# Patient Record
Sex: Female | Born: 1955 | Race: Black or African American | Hispanic: No | Smoking: Never smoker
Health system: Southern US, Community
[De-identification: ages and names within clinical notes are randomized; demographics above are authoritative.]

## PROBLEM LIST (undated history)

## (undated) DIAGNOSIS — M549 Dorsalgia, unspecified: Secondary | ICD-10-CM

## (undated) DIAGNOSIS — I10 Essential (primary) hypertension: Secondary | ICD-10-CM

## (undated) DIAGNOSIS — Z8489 Family history of other specified conditions: Secondary | ICD-10-CM

## (undated) DIAGNOSIS — IMO0001 Reserved for inherently not codable concepts without codable children: Secondary | ICD-10-CM

## (undated) DIAGNOSIS — G8929 Other chronic pain: Secondary | ICD-10-CM

## (undated) DIAGNOSIS — Z5189 Encounter for other specified aftercare: Secondary | ICD-10-CM

## (undated) DIAGNOSIS — G709 Myoneural disorder, unspecified: Secondary | ICD-10-CM

## (undated) DIAGNOSIS — M1990 Unspecified osteoarthritis, unspecified site: Secondary | ICD-10-CM

## (undated) DIAGNOSIS — E785 Hyperlipidemia, unspecified: Secondary | ICD-10-CM

## (undated) HISTORY — PX: COLONOSCOPY: SHX174

## (undated) HISTORY — PX: POLYPECTOMY: SHX149

## (undated) HISTORY — PX: BACK SURGERY: SHX140

## (undated) HISTORY — DX: Hyperlipidemia, unspecified: E78.5

## (undated) HISTORY — DX: Encounter for other specified aftercare: Z51.89

## (undated) HISTORY — PX: TRIGGER FINGER RELEASE: SHX641

## (undated) HISTORY — DX: Myoneural disorder, unspecified: G70.9

## (undated) HISTORY — DX: Reserved for inherently not codable concepts without codable children: IMO0001

## (undated) HISTORY — DX: Unspecified osteoarthritis, unspecified site: M19.90

---

## 2011-01-06 ENCOUNTER — Encounter (HOSPITAL_COMMUNITY): Payer: Self-pay

## 2011-01-06 ENCOUNTER — Encounter: Payer: Self-pay | Admitting: Emergency Medicine

## 2011-01-06 ENCOUNTER — Emergency Department (HOSPITAL_COMMUNITY)
Admission: EM | Admit: 2011-01-06 | Discharge: 2011-01-06 | Disposition: A | Discharge status: Home / Self Care | Payer: Medicaid Other | Source: Home / Self Care | Attending: Family Medicine | Admitting: Family Medicine

## 2011-01-06 ENCOUNTER — Emergency Department (HOSPITAL_COMMUNITY)
Admission: EM | Admit: 2011-01-06 | Discharge: 2011-01-06 | Disposition: A | Discharge status: Home / Self Care | Payer: Medicaid Other | Attending: Emergency Medicine | Admitting: Emergency Medicine

## 2011-01-06 DIAGNOSIS — I889 Nonspecific lymphadenitis, unspecified: Secondary | ICD-10-CM | POA: Insufficient documentation

## 2011-01-06 DIAGNOSIS — R209 Unspecified disturbances of skin sensation: Secondary | ICD-10-CM | POA: Insufficient documentation

## 2011-01-06 DIAGNOSIS — M545 Low back pain, unspecified: Secondary | ICD-10-CM | POA: Insufficient documentation

## 2011-01-06 DIAGNOSIS — M79609 Pain in unspecified limb: Secondary | ICD-10-CM | POA: Insufficient documentation

## 2011-01-06 DIAGNOSIS — R220 Localized swelling, mass and lump, head: Secondary | ICD-10-CM | POA: Insufficient documentation

## 2011-01-06 DIAGNOSIS — I10 Essential (primary) hypertension: Secondary | ICD-10-CM | POA: Insufficient documentation

## 2011-01-06 DIAGNOSIS — G8929 Other chronic pain: Secondary | ICD-10-CM | POA: Insufficient documentation

## 2011-01-06 DIAGNOSIS — Z79899 Other long term (current) drug therapy: Secondary | ICD-10-CM | POA: Insufficient documentation

## 2011-01-06 DIAGNOSIS — M549 Dorsalgia, unspecified: Secondary | ICD-10-CM

## 2011-01-06 DIAGNOSIS — H9209 Otalgia, unspecified ear: Secondary | ICD-10-CM | POA: Insufficient documentation

## 2011-01-06 HISTORY — DX: Dorsalgia, unspecified: M54.9

## 2011-01-06 HISTORY — DX: Essential (primary) hypertension: I10

## 2011-01-06 HISTORY — DX: Other chronic pain: G89.29

## 2011-01-06 MED ORDER — PENICILLIN V POTASSIUM 500 MG PO TABS: 500.0000 mg | ORAL_TABLET | Freq: Four times a day (QID) | ORAL | Status: AC

## 2011-01-06 MED ORDER — HYDROCODONE-ACETAMINOPHEN 5-325 MG PO TABS: 1.0000 | ORAL_TABLET | Freq: Four times a day (QID) | ORAL | Status: AC | PRN

## 2011-01-06 MED ORDER — NAPROXEN 500 MG PO TABS: 500.0000 mg | ORAL_TABLET | Freq: Two times a day (BID) | ORAL | Status: DC

## 2011-01-06 MED ORDER — CYCLOBENZAPRINE HCL 10 MG PO TABS: 10.0000 mg | ORAL_TABLET | Freq: Three times a day (TID) | ORAL | Status: AC | PRN

## 2011-01-06 NOTE — ED Provider Notes (Signed)
History     CSN: [id number] Arrival date & time: 01/06/2011  3:14 PM   First MD Initiated Contact with Patient 01/06/11 1516      Chief Complaint  Patient presents with  . Follow-up    (Consider location/radiation/quality/duration/timing/severity/associated sxs/prior treatment) HPI Comments: Pt here right after leaving the emergency department after being evaluated and treated by an ED physician for chronic back pain exacerbation and left side cervical adenitis. Appears as she got confused as she was told to follow up here and she though she needed to do it today. She has prescriptions for antibiotic, analgesics, antiinflammatories and muscle relaxant all appropriate for her diagnosis. Denies any new symptom since she was seen in the ED.      The history is provided by the patient.    Past Medical History  Diagnosis Date  . Chronic back pain   . Hypertension     Past Surgical History  Procedure Date  . Back surgery   . Cesarean section   . Trigger finger release     No family history on file.  History  Substance Use Topics  . Smoking status: Never Smoker   . Smokeless tobacco: Not on file  . Alcohol Use: No    OB History    Grav Para Term Preterm Abortions TAB SAB Ect Mult Living                  Review of Systems  Constitutional: Negative for fever and chills.  HENT: Positive for ear pain. Negative for congestion, sore throat, trouble swallowing, dental problem, sinus pressure and tinnitus.   Eyes: Negative for visual disturbance.  Respiratory: Negative for cough and shortness of breath.   Cardiovascular: Negative for chest pain.  Gastrointestinal: Negative for nausea, vomiting, abdominal pain and bowel incontinence.  Genitourinary: Negative for bladder incontinence, dysuria and hematuria.  Musculoskeletal: Positive for back pain.  Skin: Negative for rash.  Neurological: Positive for tingling, numbness and paresthesias. Negative for weakness and  headaches.  Hematological: Does not bruise/bleed easily.    Allergies  Review of patient's allergies indicates no known allergies.  Home Medications   Current Outpatient Rx  Name Route Sig Dispense Refill  . ACETAMINOPHEN 500 MG PO TABS Oral Take 500 mg by mouth every 6 (six) hours as needed. For pain     . CYCLOBENZAPRINE HCL 10 MG PO TABS Oral Take 1 tablet (10 mg total) by mouth 3 (three) times daily as needed for muscle spasms. 20 tablet 0  . HYDROCODONE-ACETAMINOPHEN 5-325 MG PO TABS Oral Take 1-2 tablets by mouth every 6 (six) hours as needed for pain. 14 tablet 0  . LOSARTAN POTASSIUM-HCTZ 100-12.5 MG PO TABS Oral Take 1 tablet by mouth daily.      Marland Kitchen NAPROXEN 500 MG PO TABS Oral Take 1 tablet (500 mg total) by mouth 2 (two) times daily. 14 tablet 0  . PENICILLIN V POTASSIUM 500 MG PO TABS Oral Take 1 tablet (500 mg total) by mouth 4 (four) times daily. 40 tablet 0  . RED YEAST RICE PO Oral Take 1 tablet by mouth every evening.        BP 150/82  Pulse 72  Temp(Src) 98 F (36.7 C) (Oral)  Resp 16  SpO2 100%  Physical Exam  Nursing note and vitals reviewed. Constitutional: She is oriented to person, place, and time. She appears well-developed and well-nourished. No distress.  HENT:  Head: Normocephalic and atraumatic.  Right Ear: External ear normal.  Left  Ear: External ear normal.  Nose: Nose normal.  Mouth/Throat: Oropharynx is clear and moist. No oropharyngeal exudate.       Normal TMs bilaterally. Mouth: Wears upper dentures. Several pieces missing in lower jaw with mild periodontal disease but no obvious abscess or focal tenderness.  Mouth floor feels normal with no xialolithiasis palpated.   Eyes: Conjunctivae and EOM are normal. Pupils are equal, round, and reactive to light.  Neck: Normal range of motion. Neck supple. No tracheal deviation present. No thyromegaly present.       Left side submandibular mass impress enlarged minimally tender submandibular  lymphadenopathy.  Cardiovascular: Normal rate, regular rhythm and normal heart sounds.   Pulmonary/Chest: Effort normal and breath sounds normal. She has no wheezes. She has no rales.  Neurological: She is alert and oriented to person, place, and time.    ED Course  Procedures (including critical care time)  Labs Reviewed - No data to display No results found.   1. Chronic back pain   2. Cervical adenitis       MDM  I reviewed Dr. Velta Addison note from 3 hours ago today, also performed brief physical exam. Pt history and findings on exam are unchanged from prior. I agree with the prior diagnosis and her prescriptions given to patient appear appropriate.  Explained to patient she needs to complete her treatment and return if persistent symptoms or return earlier if worsening or new symptoms like fever. I actually encouraged her to follow up with her PCP even if symptoms resolve for follow up.  Then if persistent neck swelling after complete treatment she should follow up either with her PCP or go to the emergency department as in that case is very likely she would need neck imaging like CT or MRI which we don't have the capabilities to perform at [facility]. Pt. Visit was charged with a level 2.       Sharin Grave, MD 01/07/11 1233

## 2011-01-06 NOTE — ED Notes (Signed)
Pt just left ED, states they were concerned about the swelling to lt side of neck and told her to come here.  Pt has papers and rx x 4 from ED. Unable to get clear picture of why pt presents here.  Pt states she needs rx for pain patches she used previously.

## 2011-01-06 NOTE — ED Notes (Signed)
Pt sts hx of chronic back pain and surgery; pt sts pain in lower back x several weeks and pt sts is out of her meds; pt c/o sore throat and left ear pain x several weeks

## 2011-01-06 NOTE — ED Provider Notes (Signed)
History     CSN: [id number] Arrival date & time: 01/06/2011 10:53 AM   First MD Initiated Contact with Patient 01/06/11 1117      Chief Complaint  Patient presents with  . Back Pain  . Otalgia    (Consider location/radiation/quality/duration/timing/severity/associated sxs/prior treatment) Patient is a 54 y.o. female presenting with back pain and ear pain. The history is provided by the patient.  Back Pain  This is a chronic problem. Episode onset: Acute exacerbation of her chronic back pain 2 weeks ago. The problem occurs constantly. The problem has not changed since onset.The pain is associated with no known injury. The pain is present in the lumbar spine. The quality of the pain is described as shooting, aching and stabbing. The pain radiates to the left thigh. The pain is at a severity of 8/10. The pain is moderate. The symptoms are aggravated by bending, twisting and certain positions. Associated symptoms include numbness, leg pain, paresthesias and tingling. Pertinent negatives include no chest pain, no fever, no headaches, no abdominal pain, no bowel incontinence, no perianal numbness, no bladder incontinence, no dysuria, no paresis and no weakness. She has tried NSAIDs for the symptoms. The treatment provided mild relief.  Otalgia Pertinent negatives include no headaches, no sore throat, no abdominal pain, no vomiting, no cough and no rash.   patient with several week history of complaints of soreness to the left ear and swelling in the submandibular area. No significant pain however pain would be about 2/10 no complain of any tooth pain no fever, the swelling has increased slightly. Patient also has a history of hypertension as ordered and [facility].   Past Medical History  Diagnosis Date  . Chronic back pain     History reviewed. No pertinent past surgical history.  History reviewed. No pertinent family history.  History  Substance Use Topics  . Smoking status:  Never Smoker   . Smokeless tobacco: Not on file  . Alcohol Use: No    OB History    Grav Para Term Preterm Abortions TAB SAB Ect Mult Living                  Review of Systems  Constitutional: Negative for fever and chills.  HENT: Positive for ear pain. Negative for congestion, sore throat, trouble swallowing, dental problem, sinus pressure and tinnitus.   Eyes: Negative for visual disturbance.  Respiratory: Negative for cough and shortness of breath.   Cardiovascular: Negative for chest pain.  Gastrointestinal: Negative for nausea, vomiting, abdominal pain and bowel incontinence.  Genitourinary: Negative for bladder incontinence, dysuria and hematuria.  Musculoskeletal: Positive for back pain.  Skin: Negative for rash.  Neurological: Positive for tingling, numbness and paresthesias. Negative for weakness and headaches.  Hematological: Does not bruise/bleed easily.    Allergies  Review of patient's allergies indicates no known allergies.  Home Medications   Current Outpatient Rx  Name Route Sig Dispense Refill  . ACETAMINOPHEN 500 MG PO TABS Oral Take 500 mg by mouth every 6 (six) hours as needed. For pain     . LOSARTAN POTASSIUM-HCTZ 100-12.5 MG PO TABS Oral Take 1 tablet by mouth daily.      . RED YEAST RICE PO Oral Take 1 tablet by mouth every evening.      . CYCLOBENZAPRINE HCL 10 MG PO TABS Oral Take 1 tablet (10 mg total) by mouth 3 (three) times daily as needed for muscle spasms. 20 tablet 0  . HYDROCODONE-ACETAMINOPHEN 5-325 MG  PO TABS Oral Take 1-2 tablets by mouth every 6 (six) hours as needed for pain. 14 tablet 0  . NAPROXEN 500 MG PO TABS Oral Take 1 tablet (500 mg total) by mouth 2 (two) times daily. 14 tablet 0  . PENICILLIN V POTASSIUM 500 MG PO TABS Oral Take 1 tablet (500 mg total) by mouth 4 (four) times daily. 40 tablet 0    BP 159/94  Pulse 95  Temp(Src) 97.8 F (36.6 C) (Oral)  Resp 20  SpO2 99%  Physical Exam  Nursing note and vitals  reviewed. Constitutional: She is oriented to person, place, and time. She appears well-developed and well-nourished. No distress.  HENT:  Head: Normocephalic and atraumatic.  Right Ear: External ear normal.  Left Ear: External ear normal.  Mouth/Throat: Oropharynx is clear and moist.       Noted facial swelling on the left side more around the area and cheek and jaw, left submandibular swelling and palpable 1 x 1.5 cm mass without significant tenderness and no redness. Oral exam appears normal some dental issues but nothing obvious swelling. No obvious tooth abscess.  Eyes: Conjunctivae are normal. Pupils are equal, round, and reactive to light.  Neck: Normal range of motion. Neck supple. No tracheal deviation present.       Left submandibular mass measuring about 1 cm to 1-1/2 cm in size no significant tenderness suggestive of an enlarged lymph node surrounding swelling and some swelling up around the jaw and cheek area, no erythema. No obvious thyroid nodule.   Cardiovascular: Normal rate, regular rhythm, normal heart sounds and intact distal pulses.   Pulmonary/Chest: Effort normal and breath sounds normal.  Abdominal: Soft. Bowel sounds are normal. There is no tenderness.  Musculoskeletal: Normal range of motion.       Mild tenderness to bilateral lumbar area of the back greater on the left side. Left lower trimming without focal deficit distally although patient describes numbness to the lateral aspect of the left thigh.  Lymphadenopathy:    She has cervical adenopathy.  Neurological: She is alert and oriented to person, place, and time. No cranial nerve deficit. She exhibits normal muscle tone. Coordination normal.  Skin: Skin is warm. No rash noted.    ED Course  Procedures (including critical care time)  Labs Reviewed - No data to display No results found.   1. Back pain, chronic   2. Cervical adenitis       MDM   Patient presents to emergency department with 2  complaints. First is she has a history of chronic back pain with left-sided sciatica kind of symptoms that have been long-term. On Thanksgiving she exacerbated her chronic back pain has been increased pain. She is new to the area [facility]. The other problem is left submandibular and mandibular swelling that has been present for several weeks. Patient feels it is related to her ear. Exam however shows no TM or ear findings consistent with problem the location of the swelling is consistent with adenitis in the submandibular area and more suggestive of a tooth problem in others no tooth pain. Will Rx with penicillin and patient will need close followup if does not improve. Patient given resource guide for followup for both problems, and also verbally was given directions to the [facility]. The back will be treated with Flexeril Naprosyn and a limited prescription of hydrocodone.  Patient's back pain complaint although an accessory duration of her normal back pain is  status quo regarding any of the neuro findings of the left lower extremity.        Shelda Jakes, MD 01/06/11 [phone]

## 2011-02-14 ENCOUNTER — Emergency Department (HOSPITAL_COMMUNITY)
Admission: EM | Admit: 2011-02-14 | Discharge: 2011-02-14 | Disposition: A | Discharge status: Home / Self Care | Payer: Medicaid Other | Attending: Emergency Medicine | Admitting: Emergency Medicine

## 2011-02-14 ENCOUNTER — Encounter (HOSPITAL_COMMUNITY): Payer: Self-pay | Admitting: *Deleted

## 2011-02-14 DIAGNOSIS — E049 Nontoxic goiter, unspecified: Secondary | ICD-10-CM | POA: Insufficient documentation

## 2011-02-14 DIAGNOSIS — J029 Acute pharyngitis, unspecified: Secondary | ICD-10-CM | POA: Insufficient documentation

## 2011-02-14 DIAGNOSIS — I10 Essential (primary) hypertension: Secondary | ICD-10-CM | POA: Insufficient documentation

## 2011-02-14 DIAGNOSIS — G8929 Other chronic pain: Secondary | ICD-10-CM | POA: Insufficient documentation

## 2011-02-14 DIAGNOSIS — M549 Dorsalgia, unspecified: Secondary | ICD-10-CM | POA: Insufficient documentation

## 2011-02-14 DIAGNOSIS — R591 Generalized enlarged lymph nodes: Secondary | ICD-10-CM

## 2011-02-14 DIAGNOSIS — R599 Enlarged lymph nodes, unspecified: Secondary | ICD-10-CM | POA: Insufficient documentation

## 2011-02-14 DIAGNOSIS — E010 Iodine-deficiency related diffuse (endemic) goiter: Secondary | ICD-10-CM

## 2011-02-14 LAB — BASIC METABOLIC PANEL
CO2: 29 mEq/L (ref 19–32)
Calcium: 10 mg/dL (ref 8.4–10.5)
Chloride: 100 mEq/L (ref 96–112)
Creatinine, Ser: 0.79 mg/dL (ref 0.50–1.10)
Glucose, Bld: 111 mg/dL — ABNORMAL HIGH (ref 70–99)
Sodium: 139 mEq/L (ref 135–145)

## 2011-02-14 LAB — DIFFERENTIAL
Basophils Absolute: 0 10*3/uL (ref 0.0–0.1)
Basophils Relative: 0 % (ref 0–1)
Eosinophils Absolute: 0.1 K/uL (ref 0.0–0.7)
Eosinophils Relative: 1 % (ref 0–5)
Lymphocytes Relative: 42 % (ref 12–46)
Lymphs Abs: 2.3 10*3/uL (ref 0.7–4.0)
Monocytes Absolute: 0.4 10*3/uL (ref 0.1–1.0)
Monocytes Relative: 7 % (ref 3–12)
Neutro Abs: 2.7 10*3/uL (ref 1.7–7.7)
Neutrophils Relative %: 49 % (ref 43–77)

## 2011-02-14 LAB — BASIC METABOLIC PANEL WITH GFR
BUN: 13 mg/dL (ref 6–23)
GFR calc Af Amer: >90 mL/min (ref >90)
GFR calc non Af Amer: >90 mL/min (ref >90)
Potassium: 4.1 meq/L (ref 3.5–5.1)

## 2011-02-14 LAB — CBC
HCT: 38.9 % (ref 36.0–46.0)
Hemoglobin: 12.9 g/dL (ref 12.0–15.0)
MCH: 32.2 pg (ref 26.0–34.0)
MCHC: 33.2 g/dL (ref 30.0–36.0)
MCV: 97 fL (ref 78.0–100.0)
Platelets: 238 K/uL (ref 150–400)
RBC: 4.01 MIL/uL (ref 3.87–5.11)
RDW: 12.6 % (ref 11.5–15.5)
WBC: 5.4 10*3/uL (ref 4.0–10.5)

## 2011-02-14 LAB — T4, FREE: Free T4: 1 ng/dL (ref 0.80–1.80)

## 2011-02-14 LAB — TSH: TSH: 1.482 u[IU]/mL (ref 0.350–4.500)

## 2011-02-14 NOTE — ED Notes (Signed)
Patient here for swelling to left side of thraot, hx of same and seen 1 month ago, tx with abx, sts that she was told if no improvement she may need a ct scan. Also complains of dry cough at night

## 2011-02-14 NOTE — ED Provider Notes (Signed)
History     CSN: [id number]  Arrival date & time 02/14/11  1056   First MD Initiated Contact with Patient 02/14/11 1132      Chief Complaint  Patient presents with  . Sore Throat    (Consider location/radiation/quality/duration/timing/severity/associated sxs/prior treatment) HPI Comments: Patient presented in early December 12 some back problems but also some left ear pain associated with some swelling on the left side of her neck. She is told that she had an enlarged lymph node and was given antibiotics. She reports the antibiotics did not help. She reports the area is not exactly painful or tender but she can feel a fullness in the left side of her neck and also when she swallows she again feels a fullness on that side. She denies any history of lymphoma or thyroid problems. She reports that she has gained some weight in the last few years but she attributed that to her chronic back problems. She did establish without the medical group and sees Dr. Concepcion Elk who is currently treating her high blood pressure as well as cholesterol. She reports that she has been seeing some success with this. She reports that he also is aware of that swollen area of her neck but was not doing anything about it at this time because he was more focused on her blood pressure and cholesterol. She denies fevers or chills. She denies rash or stiff neck. She denies chest pain, cough, shortness of breath. She reports her current back problems are at its baseline with no new recent injuries.  Patient is a 56 y.o. female presenting with pharyngitis. The history is provided by the patient.  Sore Throat Pertinent negatives include no shortness of breath.    Past Medical History  Diagnosis Date  . Chronic back pain   . Hypertension     Past Surgical History  Procedure Date  . Back surgery   . Cesarean section   . Trigger finger release     History reviewed. No pertinent family history.  History  Substance Use  Topics  . Smoking status: Never Smoker   . Smokeless tobacco: Not on file  . Alcohol Use: No    OB History    Grav Para Term Preterm Abortions TAB SAB Ect Mult Living                  Review of Systems  Constitutional: Positive for fatigue. Negative for fever and unexpected weight change.  HENT: Positive for neck pain. Negative for neck stiffness.   Respiratory: Negative for cough, chest tightness and shortness of breath.   Musculoskeletal: Positive for back pain.  Skin: Negative for rash.  Hematological: Positive for adenopathy.    Allergies  Lortab  Home Medications   Current Outpatient Rx  Name Route Sig Dispense Refill  . OVER THE COUNTER MEDICATION Oral Take 1 capsule by mouth at bedtime. Fish Oil w/ vitamin D3  OTC.    Marland Kitchen PRESCRIPTION MEDICATION Transdermal Place 1 patch onto the skin every 12 (twelve) hours as needed. As needed for pain. 12 hours on/off. Pain patch.    Marland Kitchen SIMVASTATIN 40 MG PO TABS Oral Take 40 mg by mouth at bedtime.    . TELMISARTAN-HCTZ 40-12.5 MG PO TABS Oral Take 1 tablet by mouth daily.      BP 136/81  Pulse 83  Temp(Src) 97.8 F (36.6 C) (Oral)  Resp 20  SpO2 97%  Physical Exam  Nursing note and vitals reviewed. Constitutional: She appears well-developed and  well-nourished.  HENT:  Head: Normocephalic and atraumatic.    Mouth/Throat: Uvula is midline, oropharynx is clear and moist and mucous membranes are normal.  Eyes: Pupils are equal, round, and reactive to light.  Neck: Normal range of motion. Neck supple.  Pulmonary/Chest: Effort normal and breath sounds normal. No accessory muscle usage. Not tachypneic. No respiratory distress. She has no decreased breath sounds. She has no wheezes. She has no rhonchi.  Abdominal: Soft. Bowel sounds are normal.  Neurological: She is alert.  Skin: Skin is warm. No rash noted. No pallor.    ED Course  Procedures (including critical care time)   Labs Reviewed  TSH  T4, FREE  CBC    DIFFERENTIAL  BASIC METABOLIC PANEL   No results found.   1. Thyromegaly   2. Lymphadenopathy       MDM  Will draw TSH and free T 4 and leave note to Dr. Concepcion Elk to recheck these labs and that she can follow up with PCP regarding these symptoms.  No emergent condition exists at this time.  No stridor, RA sats are 97% and normal, no abn lung sounds on auscultation.  BP and other vital signs are WNL and seem controlled on her antihypertensives.            Gavin Pound. Arvis Zwahlen, MD 02/14/11 1155

## 2011-02-14 NOTE — ED Notes (Signed)
To ed for further eval of sore throat. States she was in ed for same last month. Given abx, but states she it's feeling better.

## 2011-02-14 NOTE — Discharge Instructions (Signed)
 Swollen Lymph Nodes The lymphatic system filters fluid from around cells. It is like a system of blood vessels. These channels carry lymph instead of blood. The lymphatic system is an important part of the immune (disease fighting) system. When people talk about swollen glands in the neck, they are usually talking about swollen lymph nodes. The lymph nodes are like the little traps for infection. You and your caregiver may be able to feel lymph nodes, especially swollen nodes, in these common areas: the groin (inguinal area), armpits (axilla), and above the clavicle (supraclavicular). You may also feel them in the neck (cervical) and the back of the head just above the hairline (occipital). Swollen glands occur when there is any condition in which the body responds with an allergic type of reaction. For instance, the glands in the neck can become swollen from insect bites or any type of minor infection on the head. These are very noticeable in children with only minor problems. Lymph nodes may also become swollen when there is a tumor or problem with the lymphatic system, such as Hodgkin's disease. TREATMENT   Most swollen glands do not require treatment. They can be observed (watched) for a short period of time, if your caregiver feels it is necessary. Most of the time, observation is not necessary.   Antibiotics (medicines that kill germs) may be prescribed by your caregiver. Your caregiver may prescribe these if he or she feels the swollen glands are due to a bacterial (germ) infection. Antibiotics are not used if the swollen glands are caused by a virus.  HOME CARE INSTRUCTIONS   Take medications as directed by your caregiver. Only take over-the-counter or prescription medicines for pain, discomfort, or fever as directed by your caregiver.  SEEK MEDICAL CARE IF:   If you begin to run a temperature greater than 102 F (38.9 C), or as your caregiver suggests.  MAKE SURE YOU:   Understand these  instructions.   Will watch your condition.   Will get help right away if you are not doing well or get worse.  Document Released: 01/07/2002 Document Revised: 09/29/2010 Document Reviewed: 01/17/2005 [facility] Patient Information 2012 [place], [place].    Goiter Goiter is an enlarged thyroid  gland. The thyroid  gland sits at the base of the front of the neck. The gland produces hormones that regulate mood, body temperature, pulse rate, and digestion. Most goiters are painless and are not a cause for serious concern. Goiters and conditions that cause goiters can be treated if necessary.  CAUSES  Common causes of goiter include:  Graves disease (causes too much hormone to be produced [hyperthyroidism]).   Hashimoto's disease (causes too little hormone to be produced [hypothyroidism]).   Thyroiditis (inflammation of the thyroid  sometimes caused by virus or pregnancy).   Nodular goiter (small bumps form; sometimes called toxic nodular goiter).   Pregnancy.   Thyroid  cancer (very few goiters with nodules are cancerous).   Certain medications.   Radiation exposure.   Iodine deficiency (more common in developing countries in inland populations).  RISK FACTORS Risk factors for goiter include:  A family history of goiter.   Female gender.   Inadequate iodine in the diet.   Age older than 40 years.  SYMPTOMS  Many goiters do not cause symptoms. When symptoms do occur, they may include:  Swelling in the lower part of the neck. This swelling can range from a very small bump to a large lump.   A tight feeling in the throat.  A hoarse voice.  Less commonly, a goiter may result in:  Coughing.   Wheezing.   Difficulty swallowing.   Difficulty breathing.   Bulging neck veins.   Dizziness.  When a goiter is the result of hyperthyroidism, symptoms may include:  Rapid or irregular heart beat.   Sicknessin your stomach (nausea).   Vomiting.   Diarrhea.   Shaking.    Irritable feeling.   Bulging eyes.   Weight loss.   Heat sensitivity.   Anxiety.  When a goiter is the result of hypothyroidism, symptoms may include:  Tiredness.   Dry skin.   Constipation.   Weight gain.   Irregular menstrual cycle.   Depressed mood.   Sensitivity to cold.  DIAGNOSIS  Tests used to diagnose goiter include:  A physical exam.   Blood tests, including thyroid  hormone levels and antibody testing.   Ultrasonography, computerized X-ray scan (computed tomography, CT) or computerized magnetic scan (magnetic resonance imaging, MRI).   Thyroid  scan (imaging along with safe radioactive injection).   Tissue sample taken (biopsy) of nodules. This is sometimes done to confirm that the nodules are not cancerous.  TREATMENT  Treatment will depend on the cause of the goiter. Treatment may include:  Monitoring. In some cases, no treatment is necessary, and your doctor will monitor yourcondition at regular check ups.   Medications and supplements. Thyroid  medication (thyroid  hormone replacement) is available for hyperthroidism and hypothyroidism.   If inflammation is the cause, over-the-counter medication or steroid medication may be recommended.   Goiters caused by iodine deficiency can be treated with iodine supplements or changes in diet.   Radioactive iodine treatment. Radioactive iodine is injected into the blood. It travels to the thyroid  gland, kills thyroid  cells, and reduces the size of the gland. This is only used when the thyroid  gland is overactive. Lifelong thyroid  hormone medication is often necessary after this treatment.   Surgery. A procedure to remove all or part of the gland may be recommended in severe cases or when cancer is the cause. Hormones can be taken to replace the hormones normally produced by the thyroid .  HOME CARE INSTRUCTIONS   Take medications as directed.   Follow your caregiver's recommendations for any dietary changes.    Follow up with your caregiver for further examination and testing, as directed.  PREVENTION   If you have a family history of goiter, discuss screening with your doctor.   Make sure you are getting enough iodine in your diet.   Use of iodized table salt can help prevent iodine deficiency.  Document Released: 07/07/2009 Document Revised: 09/29/2010 Document Reviewed: 07/07/2009 [facility] Patient Information 2012 [place], [place].      I have sent a message to Dr. Shelda to follow up on your blood tests and to help arrange further evaluation for the swelling.  If his office doesn't contact you, please call and inform them that you have been seen in the emergency department twice for this and that he needs to evaluate you as well in the next 1-2 weeks.

## 2011-02-23 ENCOUNTER — Ambulatory Visit (AMBULATORY_SURGERY_CENTER): Payer: Medicaid Other | Admitting: *Deleted

## 2011-02-23 DIAGNOSIS — Z86010 Personal history of colonic polyps: Secondary | ICD-10-CM

## 2011-02-23 DIAGNOSIS — Z1211 Encounter for screening for malignant neoplasm of colon: Secondary | ICD-10-CM

## 2011-02-23 MED ORDER — PEG-KCL-NACL-NASULF-NA ASC-C 100 G PO SOLR: 1.0000 | Freq: Once | ORAL | Status: DC

## 2011-02-28 ENCOUNTER — Ambulatory Visit: Payer: Medicaid Other

## 2011-03-04 ENCOUNTER — Ambulatory Visit: Payer: Medicaid Other | Attending: Internal Medicine | Admitting: Physical Therapy

## 2011-03-04 DIAGNOSIS — M545 Low back pain, unspecified: Secondary | ICD-10-CM | POA: Insufficient documentation

## 2011-03-04 DIAGNOSIS — IMO0001 Reserved for inherently not codable concepts without codable children: Secondary | ICD-10-CM | POA: Insufficient documentation

## 2011-03-04 DIAGNOSIS — M25559 Pain in unspecified hip: Secondary | ICD-10-CM | POA: Insufficient documentation

## 2011-03-09 ENCOUNTER — Ambulatory Visit (AMBULATORY_SURGERY_CENTER): Payer: Medicaid Other | Admitting: Gastroenterology

## 2011-03-09 ENCOUNTER — Encounter: Payer: Self-pay | Admitting: Gastroenterology

## 2011-03-09 DIAGNOSIS — Z1211 Encounter for screening for malignant neoplasm of colon: Secondary | ICD-10-CM

## 2011-03-09 DIAGNOSIS — Z86010 Personal history of colonic polyps: Secondary | ICD-10-CM

## 2011-03-09 MED ORDER — SODIUM CHLORIDE 0.9 % IV SOLN: 500.0000 mL | INTRAVENOUS | Status: DC

## 2011-03-09 NOTE — Progress Notes (Signed)
Patient did not experience any of the following events: a burn prior to discharge; a fall within the facility; wrong site/side/patient/procedure/implant event; or a hospital transfer or hospital admission upon discharge from the facility. (G8907) Patient did not have preoperative order for IV antibiotic SSI prophylaxis. (G8918)  

## 2011-03-09 NOTE — Op Note (Signed)
 [facility] [address] [place], [place]  [address]  COLONOSCOPY PROCEDURE REPORT  PATIENT:  Raven Mclean, Raven Mclean  MR#:  [id number] BIRTHDATE:  Oct 18, 1955, 55 yrs. old  GENDER:  female ENDOSCOPIST:  Rachael Fee, MD PROCEDURE DATE:  03/09/2011 PROCEDURE:  Colonoscopy 84[address] ASA CLASS:  Class II INDICATIONS:  polyps removed at [facility] 5 years ago, were "precancerous" per patient and she was told to have repeat examination in 5 years MEDICATIONS:  Fentanyl 100 mcg IV, These medications were titrated to patient response per physician's verbal order, Versed 10 mg IV  DESCRIPTION OF PROCEDURE:   After the risks benefits and alternatives of the procedure were thoroughly explained, informed consent was obtained.  Digital rectal exam was performed and revealed no rectal masses.   The LB [id number] endoscope was introduced through the anus and advanced to the cecum, which was identified by both the appendix and ileocecal valve, without limitations.  The quality of the prep was good..  The instrument was then slowly withdrawn as the colon was fully examined. <<PROCEDUREIMAGES>> FINDINGS:  A normal appearing cecum, ileocecal valve, and appendiceal orifice were identified. The ascending, hepatic flexure, transverse, splenic flexure, descending, sigmoid colon, and rectum appeared unremarkable (see image1, image3, and image4). Retroflexed views in the rectum revealed no abnormalities. COMPLICATIONS:  None  ENDOSCOPIC IMPRESSION: 1) Normal colon 2) No polyps or cancers  RECOMMENDATIONS: 1) Given your personal history of adenomatous (pre-cancerous) polyps, you will need a repeat colonoscopy in 5 years.  REPEAT EXAM:  5 years  ______________________________ Rachael Fee, MD  n. eSIGNED:   Rachael Fee at 03/09/2011 11:26 AM  Barbaraann Share, [phone]

## 2011-03-10 ENCOUNTER — Telehealth: Payer: Self-pay | Admitting: *Deleted

## 2011-03-10 NOTE — Telephone Encounter (Signed)
  Follow up Call-  Call back number 03/09/2011  Post procedure Call Back phone  # [phone] or cell [phone]  Permission to leave phone message Yes     Patient questions:  Left message on cell & home phone that we called to f/u after having procedure yesterday.

## 2011-03-15 ENCOUNTER — Encounter: Payer: Self-pay | Admitting: Family Medicine

## 2011-03-15 ENCOUNTER — Ambulatory Visit: Payer: Medicaid Other | Admitting: Physical Therapy

## 2011-03-15 ENCOUNTER — Ambulatory Visit (INDEPENDENT_AMBULATORY_CARE_PROVIDER_SITE_OTHER): Payer: Medicaid Other | Admitting: Family Medicine

## 2011-03-15 DIAGNOSIS — IMO0001 Reserved for inherently not codable concepts without codable children: Secondary | ICD-10-CM

## 2011-03-15 DIAGNOSIS — I10 Essential (primary) hypertension: Secondary | ICD-10-CM

## 2011-03-15 NOTE — Progress Notes (Signed)
Medical Nutrition Therapy:  Appt start time: 1430 end time:  1530.  Assessment:  Primary concerns today: Weight management and hypertension.  Raven Mclean was referred by Dr. Concepcion Elk at [facility], where she receives primary care.  She moved to [place] 7 mo ago.  When she lived in [place], [place] she was seen by sports med Dr. Merleen Milliner, and he prescribed phentermine 37.5 mg q day for wt loss.  She took this med for >41yr, and lost ~30 lb.  At that time, she also started walking 5 X wk, going to the gym 3 X wk, and ate more f & v, drank more water.  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  Since then, she has had multiple courses of steroids, which have contributed to weight gain.  Currently, her back is functional, but is painful if she does too much.   Usual eating pattern includes 2 meals and 2 snacks per day.  Everyday foods include 2-3 cups of coffee w/ 2 tsp sugar, 2 tsp whole evaporated milk.  Avoided foods include sodas and sweets (usually).  24-hr recall: B (8:30 AM)- coffee each w/ 2 tsp whole evap milk, 2 tsp sugar, 1/2 c puffed wheat, 3/4 c whole milk; L (2:30 PM)- [place]: 2 fried chx wings, 1/2 c mashed potato w/ gravy, 1/2 c green beans, 1 pc corn bread, choc cake, ice cream, 16 oz sweet tea; Snk ( PM)- seltzer water, 6 oz grape juice; D- none.  Yesterday's intake was not usual b/c she ate out at lunch (then skipped supper).  Usually eats out only 1 X mo.   Usual physical activity includes rehab appt 1 X wk and rehab exercises at home daily, plus TM walking 15-20 min 2 X wk. Audreyanna seems highly motivated to lose wt and get her BP under control.    Progress Towards Goal(s):  In progress.   Nutritional Diagnosis:  NI-1.5 Excessive energy intake As related to expenditure.  As evidenced by BMI of 38.    Intervention:  Nutrition education.  Monitoring/Evaluation:  Dietary intake,  exercise, and body weight in 1 month.

## 2011-03-15 NOTE — Patient Instructions (Addendum)
-   Limit sodium intake to 1500 mg per day.  READ LABELS.   - Continue to drink 16 oz of water first thing in the AM.  - Limit fat intake to 40 grams in a day.  READ LABELS OF EVERY FOOD CHOICE YOU MAKE! - Eat at least 3 meals and 1-2 snacks per day.  Aim for no more than 5 hours between eating.  - Breakfast: boiled egg w/ fruit or 1 slice of wholewheat toast OR plain fruit OR yogurt/cottage OR sandwich.   - Obtain twice as many veg's as protein or carbohydrate foods for both lunch and dinner. - Be as active during the day as you can.  Look for exercise opportunities in your day:  Never lie down when you can sit; never sit when you can stand; never stand when you can pace.   - When you walk, TIME your exercise each time, which is important to your being in control of how fast you progress in your exercise training.   - Track daily food intake, including what you eat, how much, and what time AND keep a daily record of your exercise.  - Coffee intake:  MEASURE every amount of sugar and cream you use, and progressively decrease both.  Determine your specific goals around this, i.e., start decreasing one or both, and by how much?

## 2011-03-16 ENCOUNTER — Ambulatory Visit: Payer: Medicaid Other | Admitting: Family Medicine

## 2011-03-18 ENCOUNTER — Other Ambulatory Visit: Payer: Self-pay | Admitting: *Deleted

## 2011-03-22 ENCOUNTER — Ambulatory Visit: Payer: Medicaid Other | Admitting: Physical Therapy

## 2011-03-24 ENCOUNTER — Other Ambulatory Visit: Payer: Self-pay | Admitting: Internal Medicine

## 2011-03-24 DIAGNOSIS — R609 Edema, unspecified: Secondary | ICD-10-CM

## 2011-03-29 ENCOUNTER — Ambulatory Visit: Payer: Medicaid Other | Admitting: Physical Therapy

## 2011-03-30 ENCOUNTER — Ambulatory Visit
Admission: RE | Admit: 2011-03-30 | Discharge: 2011-03-30 | Disposition: A | Discharge status: Home / Self Care | Payer: Medicaid Other | Source: Ambulatory Visit | Attending: Internal Medicine | Admitting: Internal Medicine

## 2011-03-30 DIAGNOSIS — R609 Edema, unspecified: Secondary | ICD-10-CM

## 2011-04-11 ENCOUNTER — Ambulatory Visit: Payer: Medicaid Other | Admitting: Family Medicine

## 2011-05-06 ENCOUNTER — Other Ambulatory Visit: Payer: Self-pay | Admitting: Internal Medicine

## 2011-05-06 DIAGNOSIS — Z1231 Encounter for screening mammogram for malignant neoplasm of breast: Secondary | ICD-10-CM

## 2011-05-27 ENCOUNTER — Ambulatory Visit: Payer: Medicaid Other

## 2011-07-06 ENCOUNTER — Ambulatory Visit
Admission: RE | Admit: 2011-07-06 | Discharge: 2011-07-06 | Disposition: A | Discharge status: Home / Self Care | Payer: Medicare Other | Source: Ambulatory Visit | Attending: Internal Medicine | Admitting: Internal Medicine

## 2011-07-06 DIAGNOSIS — Z1231 Encounter for screening mammogram for malignant neoplasm of breast: Secondary | ICD-10-CM

## 2011-09-06 ENCOUNTER — Encounter (HOSPITAL_COMMUNITY): Payer: Self-pay | Admitting: *Deleted

## 2011-09-06 ENCOUNTER — Emergency Department (HOSPITAL_COMMUNITY)
Admission: EM | Admit: 2011-09-06 | Discharge: 2011-09-06 | Disposition: A | Discharge status: Home Health | Payer: Medicare Other | Attending: Emergency Medicine | Admitting: Emergency Medicine

## 2011-09-06 DIAGNOSIS — M1990 Unspecified osteoarthritis, unspecified site: Secondary | ICD-10-CM

## 2011-09-06 DIAGNOSIS — M1710 Unilateral primary osteoarthritis, unspecified knee: Secondary | ICD-10-CM | POA: Insufficient documentation

## 2011-09-06 DIAGNOSIS — I10 Essential (primary) hypertension: Secondary | ICD-10-CM | POA: Insufficient documentation

## 2011-09-06 DIAGNOSIS — M25569 Pain in unspecified knee: Secondary | ICD-10-CM

## 2011-09-06 DIAGNOSIS — E785 Hyperlipidemia, unspecified: Secondary | ICD-10-CM | POA: Insufficient documentation

## 2011-09-06 DIAGNOSIS — G8929 Other chronic pain: Secondary | ICD-10-CM | POA: Insufficient documentation

## 2011-09-06 MED ORDER — HYDROMORPHONE HCL 2 MG PO TABS: 2.0000 mg | ORAL_TABLET | ORAL | Status: AC | PRN

## 2011-09-06 MED ORDER — NAPROXEN 500 MG PO TABS: 500.0000 mg | ORAL_TABLET | Freq: Two times a day (BID) | ORAL | Status: DC

## 2011-09-06 NOTE — ED Notes (Signed)
To ED for eval of right knee pain. States she has had the pain but worse over the past 2 wks. Ambulatory. Denies injury

## 2011-09-06 NOTE — ED Provider Notes (Signed)
History  This chart was scribed for Raven Jakes, MD by Shari Heritage. The patient was seen in room TR09C/TR09C. Patient's care was started at 1253.     CSN: [id number]  Arrival date & time 09/06/11  1253   First MD Initiated Contact with Patient 09/06/11 1331      Chief Complaint  Patient presents with  . Knee Pain    (Consider location/radiation/quality/duration/timing/severity/associated sxs/prior treatment) Patient is a 56 y.o. female presenting with knee pain. The history is provided by the patient. No language interpreter was used.  Knee Pain This is a recurrent problem. The current episode started more than 1 week ago. The problem occurs constantly. The problem has been gradually worsening. Pertinent negatives include no chest pain, no abdominal pain and no shortness of breath. The symptoms are aggravated by walking. Nothing relieves the symptoms. Treatments tried: Ibuprofen. The treatment provided mild relief.    Anis Cinelli is a 56 y.o. female who presents to the Emergency Department complaining of worsening, right medial knee pain onset 2 weeks ago. Patient also reports some milder pain in her left knee and swelling to both knees. She also says that she experienced some fever and diaphoresis last night. No fever today. She denies any injury or trauma. Patient is ambulatory, but she has pain with walking. Patient has been applying ice to her knee with no relief. She has also been taking Ibuprofen 800 mg which provided minimal relief, but it did not take away the pain completely. She denies chest pain, SOB and abdominal pain. Patient has a history of HTN, hyperlipidemia, chronic back pain and a neuromuscular disorder. She has never smoked.     Past Medical History  Diagnosis Date  . Chronic back pain   . Hypertension   . Arthritis   . Blood transfusion   . Hyperlipidemia   . Neuromuscular disorder     spasms in legs    Past Surgical History  Procedure Date  .  Back surgery   . Cesarean section   . Trigger finger release   . Colonoscopy   . Polypectomy     Family History  Problem Relation Age of Onset  . Colon cancer Neg Hx   . Esophageal cancer Neg Hx   . Stomach cancer Neg Hx   . Rectal cancer Neg Hx     History  Substance Use Topics  . Smoking status: Never Smoker   . Smokeless tobacco: Not on file  . Alcohol Use: No    OB History    Grav Para Term Preterm Abortions TAB SAB Ect Mult Living                  Review of Systems  Respiratory: Negative for shortness of breath.   Cardiovascular: Negative for chest pain.  Gastrointestinal: Negative for abdominal pain.  Musculoskeletal: Positive for arthralgias.    Allergies  Hydrocodone-acetaminophen  Home Medications   Current Outpatient Rx  Name Route Sig Dispense Refill  . HYDROCHLOROTHIAZIDE 12.5 MG PO CAPS Oral Take 12.5 mg by mouth daily.    Marland Kitchen LIDOCAINE 5 % EX PTCH Transdermal Place 1 patch onto the skin daily. Remove & Discard patch within 12 hours or as directed by MD    . SIMVASTATIN 40 MG PO TABS Oral Take 40 mg by mouth at bedtime.    Marland Kitchen VITAMIN D (ERGOCALCIFEROL) 50000 UNITS PO CAPS Oral Take 50,000 Units by mouth every 7 (seven) days. Fridays    . HYDROMORPHONE HCL 2  MG PO TABS Oral Take 1 tablet (2 mg total) by mouth every 4 (four) hours as needed for pain. 20 tablet 0  . NAPROXEN 500 MG PO TABS Oral Take 1 tablet (500 mg total) by mouth 2 (two) times daily. 14 tablet 0    BP 135/88  Pulse 76  Temp 98.6 F (37 C) (Oral)  Resp 16  SpO2 98%  Physical Exam  Constitutional: She is oriented to person, place, and time. She appears well-developed and well-nourished.  HENT:  Head: Normocephalic and atraumatic.  Cardiovascular: Normal rate and regular rhythm.   No murmur heard. Pulmonary/Chest: Effort normal and breath sounds normal. No respiratory distress. She has no wheezes. She has no rales.  Abdominal: Soft. Bowel sounds are normal. There is no tenderness.  There is no rebound.  Musculoskeletal:       Right knee: She exhibits swelling. She exhibits no effusion.       No effusion of the right knee. No joint line tenderness. Some swelling to the right knee. No increased warmth.   Neurological: She is alert and oriented to person, place, and time.    ED Course  Procedures (including critical care time) DIAGNOSTIC STUDIES: Oxygen Saturation is 98% on room air, normal by my interpretation.    COORDINATION OF CARE: 2:26pm- Patient informed of current plan for treatment and evaluation and agrees with plan at this time.    1. Knee pain   2. Arthritis       MDM  Symptoms most consistent with the knee arthritic kind of pain she's had pain in both knees but worse in the right knee no effusion no erythema no increased warmth. Patient given referral to orthopedics [facility]. Patient retrieve anti-inflammatory and pain medicine. No history of injury or fall.      I personally performed the services described in this documentation, which was scribed in my presence. The recorded information has been reviewed and considered.     Raven Jakes, MD 09/06/11 [phone]

## 2012-05-08 ENCOUNTER — Emergency Department (HOSPITAL_COMMUNITY): Payer: Medicare Other

## 2012-05-08 ENCOUNTER — Emergency Department (HOSPITAL_COMMUNITY)
Admission: EM | Admit: 2012-05-08 | Discharge: 2012-05-08 | Disposition: A | Discharge status: Home / Self Care | Payer: Medicare Other | Attending: Emergency Medicine | Admitting: Emergency Medicine

## 2012-05-08 ENCOUNTER — Encounter (HOSPITAL_COMMUNITY): Payer: Self-pay | Admitting: Emergency Medicine

## 2012-05-08 DIAGNOSIS — M25569 Pain in unspecified knee: Secondary | ICD-10-CM | POA: Insufficient documentation

## 2012-05-08 DIAGNOSIS — Z8669 Personal history of other diseases of the nervous system and sense organs: Secondary | ICD-10-CM | POA: Insufficient documentation

## 2012-05-08 DIAGNOSIS — M1710 Unilateral primary osteoarthritis, unspecified knee: Secondary | ICD-10-CM | POA: Insufficient documentation

## 2012-05-08 DIAGNOSIS — I10 Essential (primary) hypertension: Secondary | ICD-10-CM | POA: Insufficient documentation

## 2012-05-08 DIAGNOSIS — M25561 Pain in right knee: Secondary | ICD-10-CM

## 2012-05-08 DIAGNOSIS — Z79899 Other long term (current) drug therapy: Secondary | ICD-10-CM | POA: Insufficient documentation

## 2012-05-08 DIAGNOSIS — G8929 Other chronic pain: Secondary | ICD-10-CM | POA: Insufficient documentation

## 2012-05-08 DIAGNOSIS — M25469 Effusion, unspecified knee: Secondary | ICD-10-CM | POA: Insufficient documentation

## 2012-05-08 DIAGNOSIS — Z8639 Personal history of other endocrine, nutritional and metabolic disease: Secondary | ICD-10-CM | POA: Insufficient documentation

## 2012-05-08 DIAGNOSIS — Z862 Personal history of diseases of the blood and blood-forming organs and certain disorders involving the immune mechanism: Secondary | ICD-10-CM | POA: Insufficient documentation

## 2012-05-08 MED ORDER — OXYCODONE-ACETAMINOPHEN 5-325 MG PO TABS: 1.0000 | ORAL_TABLET | ORAL | Status: DC | PRN

## 2012-05-08 NOTE — ED Notes (Signed)
Pt states hx of knee and back problems.  States that she was moving this week and overdid it.  C/o rt knee pain.  States it feels like a toothache.

## 2012-05-08 NOTE — ED Provider Notes (Signed)
History     CSN: [id number]  Arrival date & time 05/08/12  1337   First MD Initiated Contact with Patient 05/08/12 1404      Chief Complaint  Patient presents with  . Knee Pain    (Consider location/radiation/quality/duration/timing/severity/associated sxs/prior treatment) HPI Knee Pain  Raven Bankspresents to the Emergency Department complaining of worsening, right  knee pain that started last week. She has a history of arthritis in the knee and pain. Patient also reports some milder pain in her left knee and swelling to both knees. . She denies any injury or trauma. Patient is ambulatory, but she has pain with walking. Patient has been applying ice to her knee with no relief. She has also been taking Ibuprofen 800 mg which provided minimal relief, but it did not take away the pain completely. She sees DR. BLACKMAN with ortho and gets CORTISONE SHOTS in her knees for pain control. She had xrays done in August of 2013  Which showed "bone on bone". She said the rain is worse because of the rain. She denies fever, diaphoresis, chest pain, SOB and abdominal pain. Patient has a history of HTN, hyperlipidemia, chronic back pain and a neuromuscular disorder. She has never smoked.  No other medical complaints at this time.     Past Medical History  Diagnosis Date  . Chronic back pain   . Hypertension   . Arthritis   . Blood transfusion   . Hyperlipidemia   . Neuromuscular disorder     spasms in legs    Past Surgical History  Procedure Laterality Date  . Back surgery    . Cesarean section    . Trigger finger release    . Colonoscopy    . Polypectomy      Family History  Problem Relation Age of Onset  . Colon cancer Neg Hx   . Esophageal cancer Neg Hx   . Stomach cancer Neg Hx   . Rectal cancer Neg Hx     History  Substance Use Topics  . Smoking status: Never Smoker   . Smokeless tobacco: Not on file  . Alcohol Use: No    OB History   Grav Para Term Preterm Abortions  TAB SAB Ect Mult Living                  Review of Systems  All other systems reviewed and are negative.    Allergies  Hydrocodone-acetaminophen  Home Medications   Current Outpatient Rx  Name  Route  Sig  Dispense  Refill  . hydrochlorothiazide (HYDRODIURIL) 25 MG tablet   Oral   Take 25 mg by mouth daily.         Marland Kitchen ibuprofen (ADVIL,MOTRIN) 200 MG tablet   Oral   Take 800 mg by mouth every 6 (six) hours as needed for pain.         . metFORMIN (GLUMETZA) 500 MG (MOD) 24 hr tablet   Oral   Take 1,000 mg by mouth 2 (two) times daily with a meal.         . oxyCODONE-acetaminophen (PERCOCET/ROXICET) 5-325 MG per tablet   Oral   Take 1 tablet by mouth every 4 (four) hours as needed for pain.   12 tablet   0     BP 156/86  Pulse 94  Temp(Src) 98.6 F (37 C) (Oral)  Resp 18  SpO2 98%  Physical Exam  Nursing note and vitals reviewed. Constitutional: She appears well-developed and well-nourished. No distress.  HENT:  Head: Normocephalic and atraumatic.  Eyes: Pupils are equal, round, and reactive to light.  Neck: Normal range of motion. Neck supple.  Cardiovascular: Normal rate and regular rhythm.   Pulmonary/Chest: Effort normal.  Abdominal: Soft.  Musculoskeletal:       Right knee: She exhibits swelling.     Right knee: She exhibits swelling. She exhibits no effusion. No effusion of the right knee. No joint line tenderness. Some swelling to the right knee. No increased warmth.     Neurological: She is alert.  Skin: Skin is warm and dry.    ED Course  Procedures (including critical care time)  Labs Reviewed - No data to display No results found.   1. Knee joint pain, right       MDM  Knee pain is consistent with etiology of arthritis. Her pain is bilateral and an exacerbation of her normal pain. No effusion no erythema no increased warmth. Patient given referral to Dr. Magnus Ivan orthopedics [facility]. Patient retrieve  anti-inflammatory and pain medicine. No history of injury or fall.    Pt has been advised of the symptoms that warrant their return to the ED. Patient has voiced understanding and has agreed to follow-up with the PCP or specialist.       Dorthula Matas, PA-C 05/08/12 1444

## 2012-05-09 NOTE — ED Provider Notes (Signed)
Medical screening examination/treatment/procedure(s) were performed by non-physician practitioner and as supervising physician I was immediately available for consultation/collaboration.    Ricahrd Schwager D Lavaeh Bau, MD 05/09/12 2216 

## 2012-10-25 ENCOUNTER — Ambulatory Visit: Payer: Medicare Other | Attending: Internal Medicine

## 2012-10-26 ENCOUNTER — Other Ambulatory Visit: Payer: Self-pay

## 2012-10-26 DIAGNOSIS — Z1231 Encounter for screening mammogram for malignant neoplasm of breast: Secondary | ICD-10-CM

## 2012-11-12 ENCOUNTER — Ambulatory Visit
Admission: RE | Admit: 2012-11-12 | Discharge: 2012-11-12 | Disposition: A | Discharge status: Home / Self Care | Payer: Medicaid Other | Source: Ambulatory Visit

## 2012-11-12 DIAGNOSIS — Z1231 Encounter for screening mammogram for malignant neoplasm of breast: Secondary | ICD-10-CM

## 2012-11-14 ENCOUNTER — Ambulatory Visit: Payer: Medicaid Other

## 2013-02-28 IMAGING — MG MM DIGITAL SCREENING BILAT
4 series · 4 of 4 positions shown · non-contrast
Comparison: none

CLINICAL DATA: Screening.

MAMMOGRAPHIC BILATERAL DIGITAL SCREENING WITH CAD

[R CC]
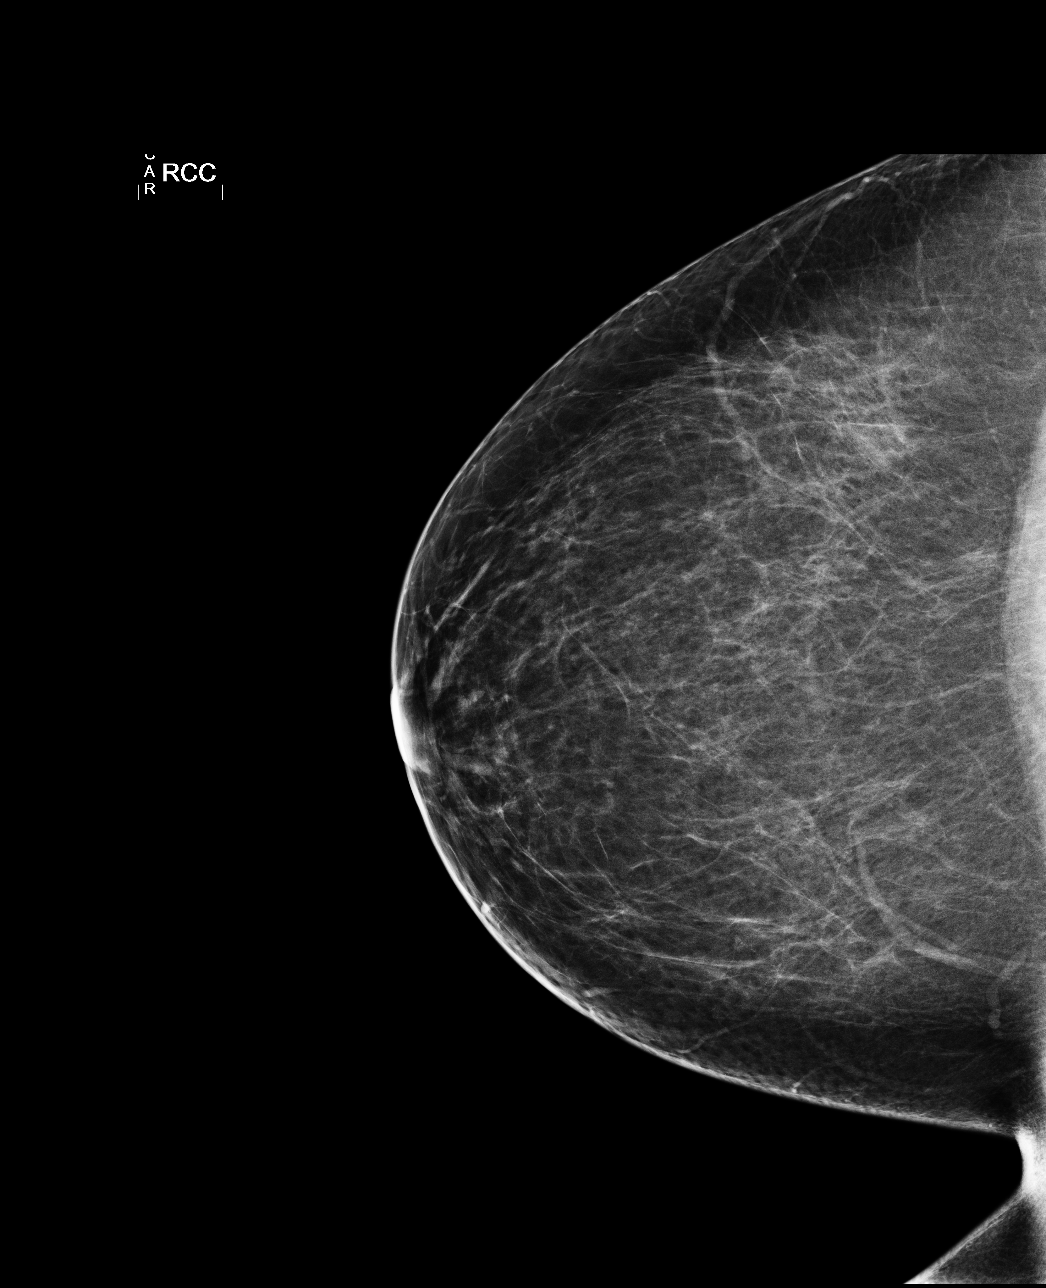

[L CC]
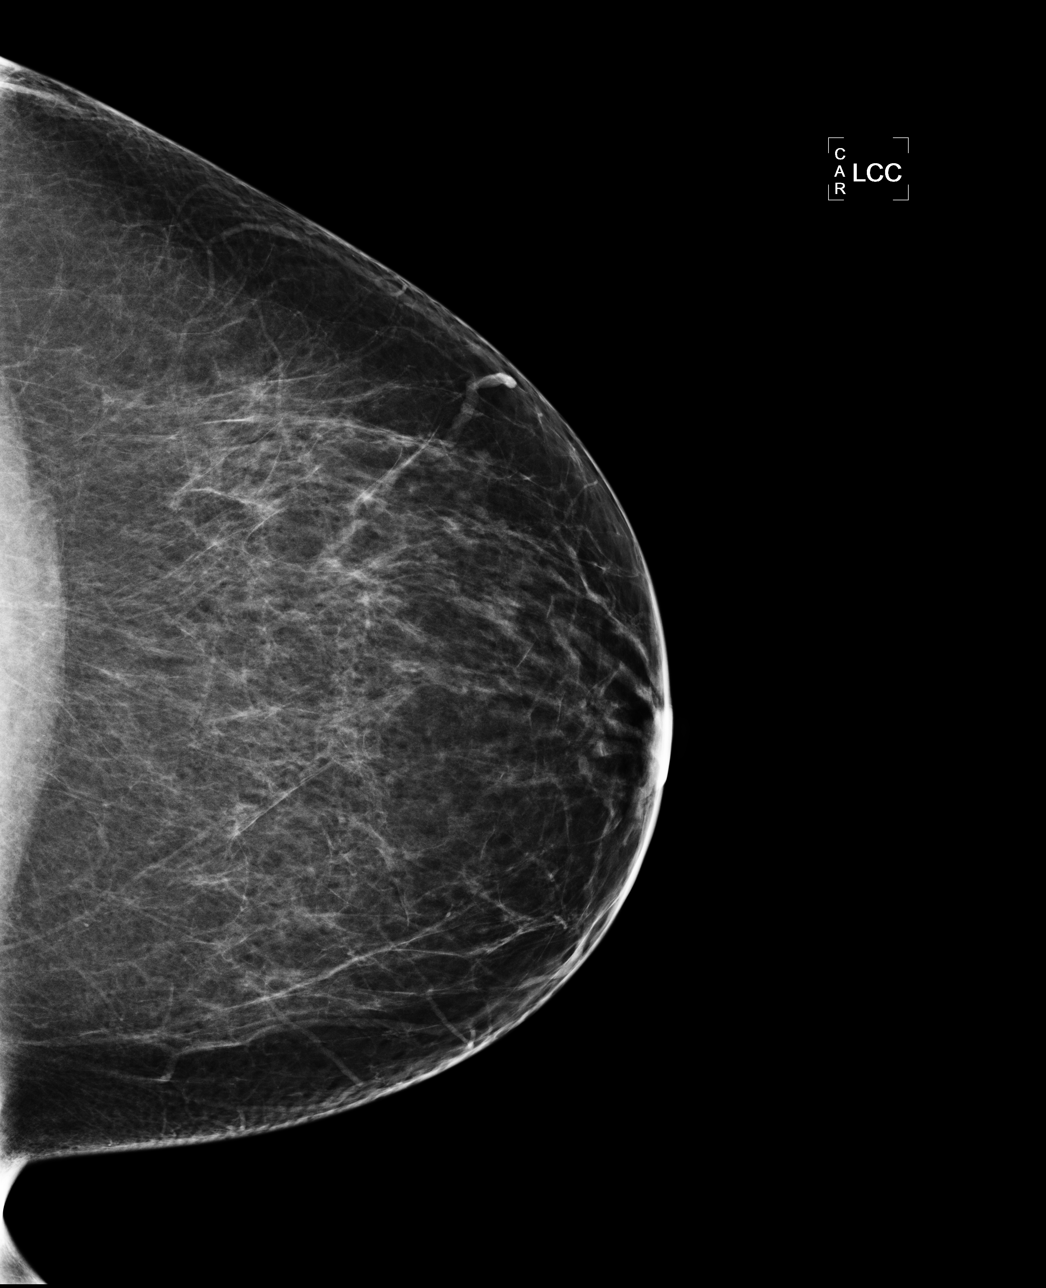

[L MLO]
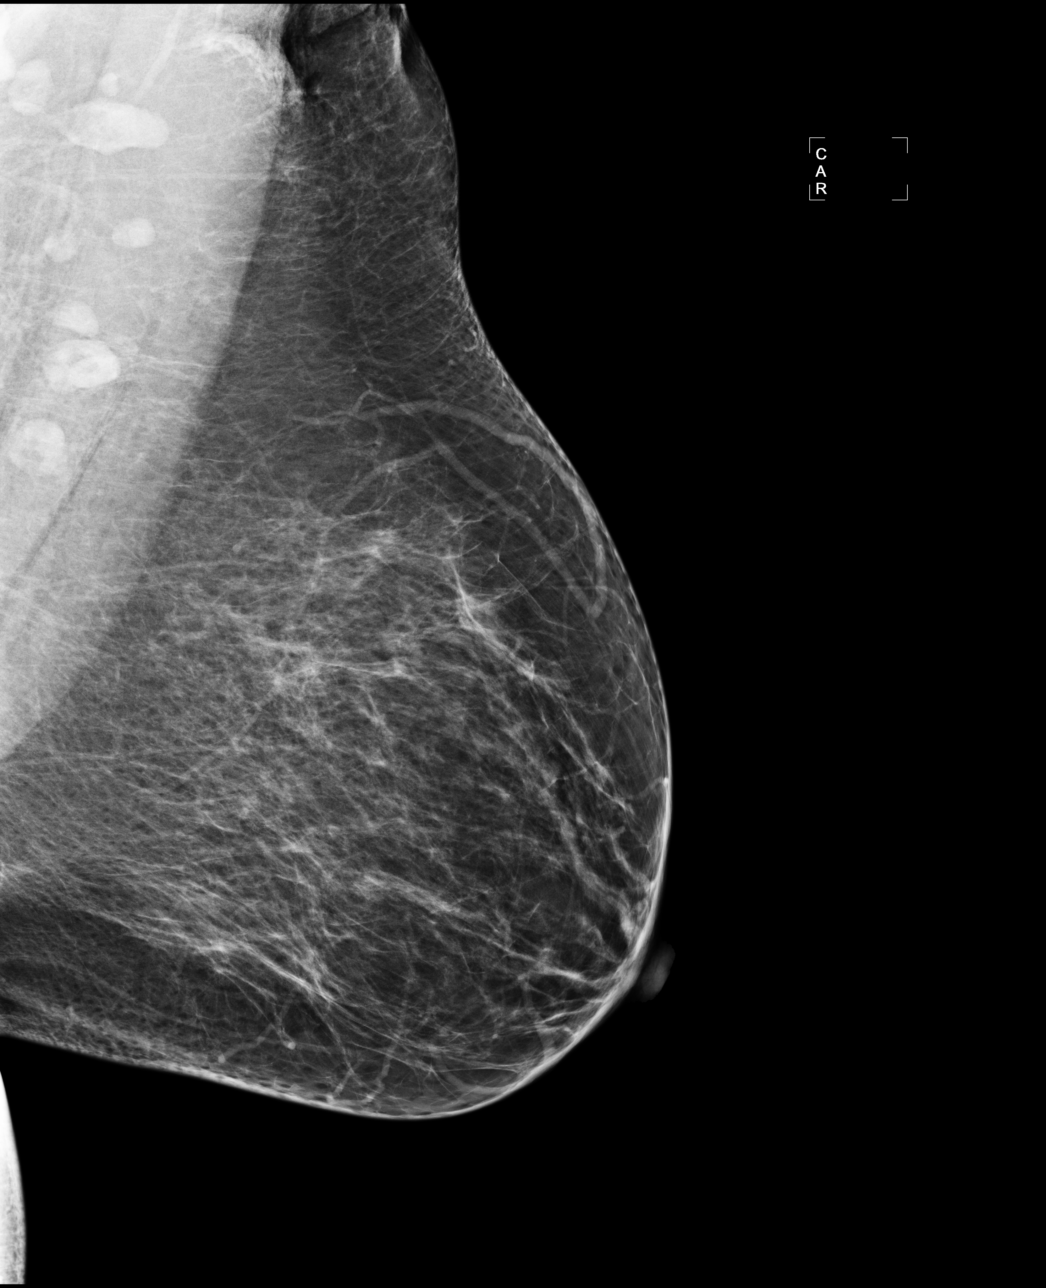

[R MLO]
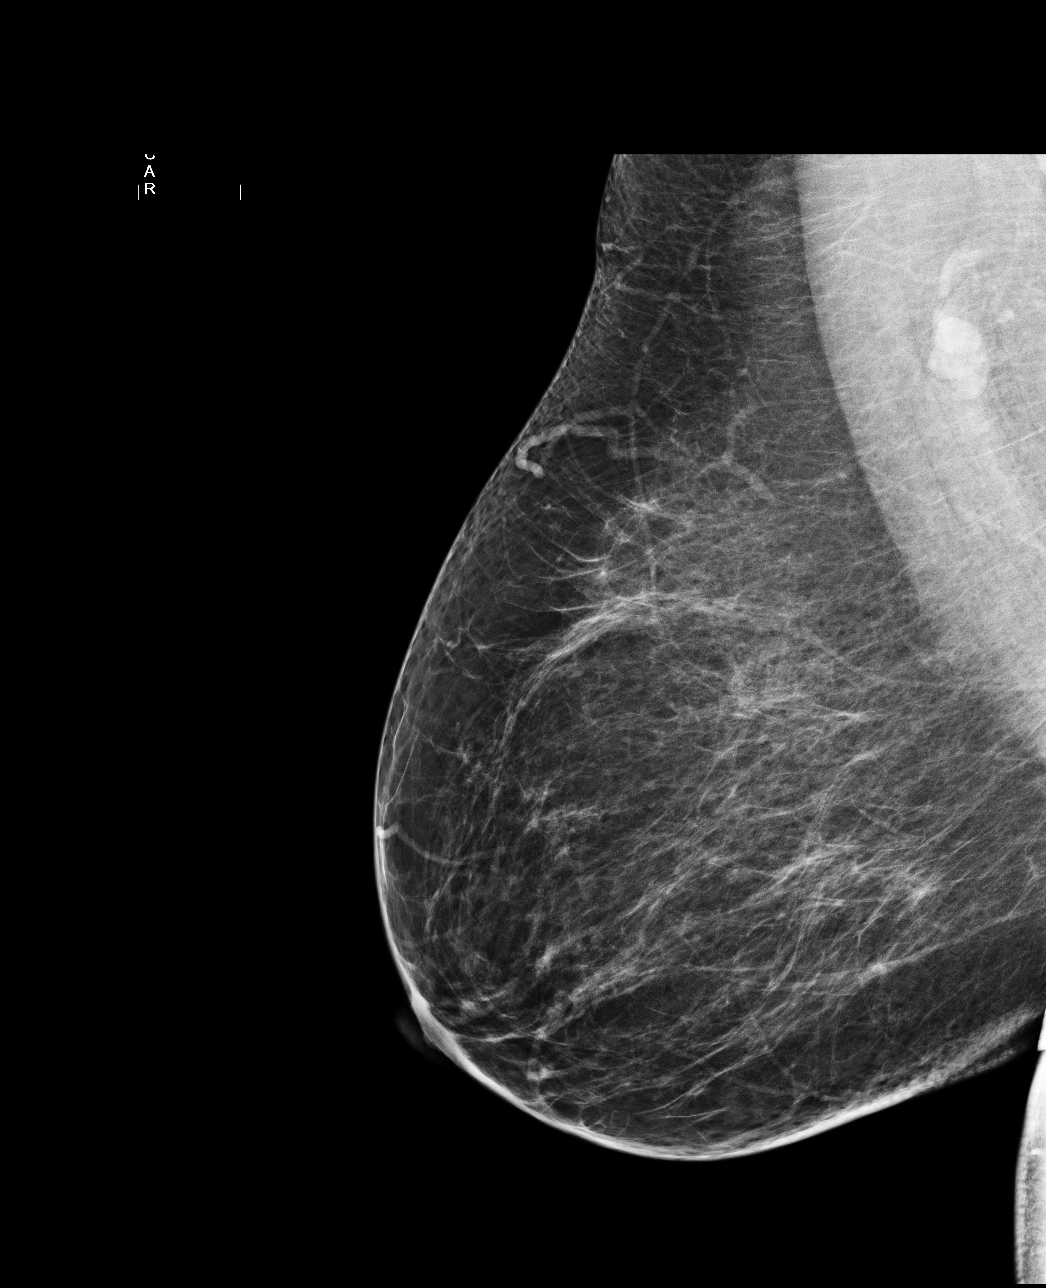

[4 of 4 positions shown; findings below may reference images not displayed]

FINDINGS: There are scattered fibroglandular densities.  No masses
or malignant type calcifications are identified.

Images were processed with CAD.
IMPRESSION: No specific mammographic evidence of malignancy.

A result letter of this screening mammogram will be mailed directly
to the patient.

RECOMMENDATION:
Screening mammogram in one year. (Code:[id number])

BI-RADS CATEGORY 1:  Negative

## 2013-06-04 ENCOUNTER — Other Ambulatory Visit (HOSPITAL_COMMUNITY): Payer: Self-pay | Admitting: Internal Medicine

## 2013-06-04 ENCOUNTER — Ambulatory Visit (HOSPITAL_COMMUNITY)
Admission: RE | Admit: 2013-06-04 | Discharge: 2013-06-04 | Disposition: A | Discharge status: Home / Self Care | Payer: Medicare PPO | Source: Ambulatory Visit | Attending: Internal Medicine | Admitting: Internal Medicine

## 2013-06-04 DIAGNOSIS — M7989 Other specified soft tissue disorders: Secondary | ICD-10-CM

## 2013-06-04 DIAGNOSIS — R600 Localized edema: Secondary | ICD-10-CM

## 2013-06-04 NOTE — Progress Notes (Signed)
VASCULAR LAB PRELIMINARY  PRELIMINARY  PRELIMINARY  PRELIMINARY  Left lower extremity venous duplex completed.    Preliminary report:  Left:  No evidence of DVT, superficial thrombosis, or Baker's cyst.  [place], RVS 06/04/2013, 4:57 PM

## 2013-11-25 ENCOUNTER — Other Ambulatory Visit: Payer: Self-pay

## 2013-11-25 DIAGNOSIS — Z1231 Encounter for screening mammogram for malignant neoplasm of breast: Secondary | ICD-10-CM

## 2013-12-13 ENCOUNTER — Ambulatory Visit
Admission: RE | Admit: 2013-12-13 | Discharge: 2013-12-13 | Disposition: A | Discharge status: Home / Self Care | Payer: Medicare PPO | Source: Ambulatory Visit

## 2013-12-13 ENCOUNTER — Encounter (INDEPENDENT_AMBULATORY_CARE_PROVIDER_SITE_OTHER): Payer: Self-pay

## 2013-12-13 DIAGNOSIS — Z1231 Encounter for screening mammogram for malignant neoplasm of breast: Secondary | ICD-10-CM

## 2014-06-05 DIAGNOSIS — G4700 Insomnia, unspecified: Secondary | ICD-10-CM | POA: Diagnosis not present

## 2014-06-05 DIAGNOSIS — I10 Essential (primary) hypertension: Secondary | ICD-10-CM | POA: Diagnosis not present

## 2014-06-05 DIAGNOSIS — Z131 Encounter for screening for diabetes mellitus: Secondary | ICD-10-CM | POA: Diagnosis not present

## 2014-06-05 DIAGNOSIS — E784 Other hyperlipidemia: Secondary | ICD-10-CM | POA: Diagnosis not present

## 2014-06-05 DIAGNOSIS — M129 Arthropathy, unspecified: Secondary | ICD-10-CM | POA: Diagnosis not present

## 2014-09-04 DIAGNOSIS — M5126 Other intervertebral disc displacement, lumbar region: Secondary | ICD-10-CM | POA: Diagnosis not present

## 2014-09-04 DIAGNOSIS — E669 Obesity, unspecified: Secondary | ICD-10-CM | POA: Diagnosis not present

## 2014-09-04 DIAGNOSIS — E784 Other hyperlipidemia: Secondary | ICD-10-CM | POA: Diagnosis not present

## 2014-09-04 DIAGNOSIS — Z6835 Body mass index (BMI) 35.0-35.9, adult: Secondary | ICD-10-CM | POA: Diagnosis not present

## 2014-09-04 DIAGNOSIS — I10 Essential (primary) hypertension: Secondary | ICD-10-CM | POA: Diagnosis not present

## 2014-10-08 DIAGNOSIS — E669 Obesity, unspecified: Secondary | ICD-10-CM | POA: Diagnosis not present

## 2014-10-08 DIAGNOSIS — M129 Arthropathy, unspecified: Secondary | ICD-10-CM | POA: Diagnosis not present

## 2014-10-08 DIAGNOSIS — I10 Essential (primary) hypertension: Secondary | ICD-10-CM | POA: Diagnosis not present

## 2014-10-08 DIAGNOSIS — Z131 Encounter for screening for diabetes mellitus: Secondary | ICD-10-CM | POA: Diagnosis not present

## 2014-10-08 DIAGNOSIS — Z6833 Body mass index (BMI) 33.0-33.9, adult: Secondary | ICD-10-CM | POA: Diagnosis not present

## 2014-10-08 DIAGNOSIS — E784 Other hyperlipidemia: Secondary | ICD-10-CM | POA: Diagnosis not present

## 2014-10-08 DIAGNOSIS — Z124 Encounter for screening for malignant neoplasm of cervix: Secondary | ICD-10-CM | POA: Diagnosis not present

## 2014-10-08 DIAGNOSIS — N760 Acute vaginitis: Secondary | ICD-10-CM | POA: Diagnosis not present

## 2014-11-11 ENCOUNTER — Other Ambulatory Visit: Payer: Self-pay

## 2014-11-11 DIAGNOSIS — Z1231 Encounter for screening mammogram for malignant neoplasm of breast: Secondary | ICD-10-CM

## 2014-12-09 DIAGNOSIS — E784 Other hyperlipidemia: Secondary | ICD-10-CM | POA: Diagnosis not present

## 2014-12-09 DIAGNOSIS — I10 Essential (primary) hypertension: Secondary | ICD-10-CM | POA: Diagnosis not present

## 2014-12-09 DIAGNOSIS — Z6834 Body mass index (BMI) 34.0-34.9, adult: Secondary | ICD-10-CM | POA: Diagnosis not present

## 2014-12-09 DIAGNOSIS — E669 Obesity, unspecified: Secondary | ICD-10-CM | POA: Diagnosis not present

## 2014-12-09 DIAGNOSIS — G4700 Insomnia, unspecified: Secondary | ICD-10-CM | POA: Diagnosis not present

## 2014-12-16 ENCOUNTER — Ambulatory Visit
Admission: RE | Admit: 2014-12-16 | Discharge: 2014-12-16 | Disposition: A | Discharge status: Home / Self Care | Payer: Commercial Managed Care - HMO | Source: Ambulatory Visit

## 2014-12-16 DIAGNOSIS — Z1231 Encounter for screening mammogram for malignant neoplasm of breast: Secondary | ICD-10-CM | POA: Diagnosis not present

## 2015-03-29 ENCOUNTER — Encounter (HOSPITAL_COMMUNITY): Payer: Self-pay | Admitting: Emergency Medicine

## 2015-03-29 ENCOUNTER — Emergency Department (HOSPITAL_COMMUNITY)
Admission: EM | Admit: 2015-03-29 | Discharge: 2015-03-29 | Disposition: A | Discharge status: Home / Self Care | Payer: Commercial Managed Care - HMO | Attending: Emergency Medicine | Admitting: Emergency Medicine

## 2015-03-29 DIAGNOSIS — L089 Local infection of the skin and subcutaneous tissue, unspecified: Secondary | ICD-10-CM | POA: Diagnosis not present

## 2015-03-29 DIAGNOSIS — S50862A Insect bite (nonvenomous) of left forearm, initial encounter: Secondary | ICD-10-CM | POA: Insufficient documentation

## 2015-03-29 DIAGNOSIS — Y998 Other external cause status: Secondary | ICD-10-CM | POA: Diagnosis not present

## 2015-03-29 DIAGNOSIS — I10 Essential (primary) hypertension: Secondary | ICD-10-CM | POA: Diagnosis not present

## 2015-03-29 DIAGNOSIS — Z7984 Long term (current) use of oral hypoglycemic drugs: Secondary | ICD-10-CM | POA: Diagnosis not present

## 2015-03-29 DIAGNOSIS — W57XXXA Bitten or stung by nonvenomous insect and other nonvenomous arthropods, initial encounter: Secondary | ICD-10-CM | POA: Diagnosis not present

## 2015-03-29 DIAGNOSIS — Y9289 Other specified places as the place of occurrence of the external cause: Secondary | ICD-10-CM | POA: Insufficient documentation

## 2015-03-29 DIAGNOSIS — S50861A Insect bite (nonvenomous) of right forearm, initial encounter: Secondary | ICD-10-CM | POA: Diagnosis present

## 2015-03-29 DIAGNOSIS — M1990 Unspecified osteoarthritis, unspecified site: Secondary | ICD-10-CM | POA: Diagnosis not present

## 2015-03-29 DIAGNOSIS — G8929 Other chronic pain: Secondary | ICD-10-CM | POA: Diagnosis not present

## 2015-03-29 DIAGNOSIS — S50361A Insect bite (nonvenomous) of right elbow, initial encounter: Secondary | ICD-10-CM | POA: Insufficient documentation

## 2015-03-29 DIAGNOSIS — Z8639 Personal history of other endocrine, nutritional and metabolic disease: Secondary | ICD-10-CM | POA: Insufficient documentation

## 2015-03-29 DIAGNOSIS — Z79899 Other long term (current) drug therapy: Secondary | ICD-10-CM | POA: Insufficient documentation

## 2015-03-29 DIAGNOSIS — Y93H2 Activity, gardening and landscaping: Secondary | ICD-10-CM | POA: Insufficient documentation

## 2015-03-29 MED ORDER — FAMOTIDINE 20 MG PO TABS: 20.0000 mg | ORAL_TABLET | Freq: Two times a day (BID) | ORAL | Status: DC

## 2015-03-29 MED ORDER — CEPHALEXIN 500 MG PO CAPS
500.0000 mg | ORAL_CAPSULE | Freq: Once | ORAL | Status: AC
  Administered 2015-03-29: 500 mg via ORAL
  Filled 2015-03-29: qty 1

## 2015-03-29 MED ORDER — FAMOTIDINE 20 MG PO TABS
20.0000 mg | ORAL_TABLET | Freq: Once | ORAL | Status: AC
  Administered 2015-03-29: 20 mg via ORAL
  Filled 2015-03-29: qty 1

## 2015-03-29 MED ORDER — HYDROXYZINE HCL 25 MG PO TABS
25.0000 mg | ORAL_TABLET | Freq: Once | ORAL | Status: AC
  Administered 2015-03-29: 25 mg via ORAL
  Filled 2015-03-29: qty 1

## 2015-03-29 MED ORDER — CEPHALEXIN 250 MG PO CAPS: 250.0000 mg | ORAL_CAPSULE | Freq: Four times a day (QID) | ORAL | Status: DC

## 2015-03-29 MED ORDER — LORATADINE 10 MG PO TABS: 10.0000 mg | ORAL_TABLET | Freq: Every day | ORAL | Status: DC

## 2015-03-29 MED ORDER — HYDROXYZINE HCL 25 MG PO TABS: 25.0000 mg | ORAL_TABLET | Freq: Four times a day (QID) | ORAL | Status: DC

## 2015-03-29 NOTE — ED Notes (Signed)
Patient states she was gardening on Thursday and then noticed a reddened area on right arm and smaller one on left arm. Patient complaining of itching to right arm.

## 2015-03-29 NOTE — Discharge Instructions (Signed)
Take the medication as directed the Atarax may make you sleepy so do not take if driving

## 2015-03-29 NOTE — ED Provider Notes (Signed)
CSN: [id number]     Arrival date & time 03/29/15  1717 History  By signing my name below, I, Stephania Fragmin, attest that this documentation has been prepared under the direction and in the presence of Debroah Baller, NP. Electronically Signed: Stephania Fragmin, ED Scribe. 03/29/2015. 5:49 PM.    Chief Complaint  Patient presents with  . Abscess   Patient is a 60 y.o. female presenting with rash. The history is provided by the patient. No language interpreter was used.  Rash Location:  Shoulder/arm Shoulder/arm rash location:  R elbow, L forearm and R forearm Quality: itchiness and redness   Severity:  Moderate Onset quality:  Gradual Duration:  3 days Timing:  Constant Progression:  Worsening Chronicity:  New Context comment:  Exposure to insects/spiders while gardening Relieved by:  Nothing Worsened by:  Nothing tried Ineffective treatments: bleach, Calamine lotion, Benadryl, and cleansing area. Associated symptoms: no shortness of breath and no throat swelling     HPI Comments: TAGAN MCKINNEY is a 60 y.o. female with a history of chronic back pain, HTN, arthritis, and HLD, who presents to the Emergency Department complaining of gradual-onset, constant, worsening areas of redness and itching on her right forearm, right elbow, and left forearm that onset about 3 days after lying on the ground while gardening. She states she woke up with a red area on her inner right arm. She had applied bleach and Calamine lotion, washed the area with hot water, and taken Benadryl with no relief. She denies throat swelling or difficulty breathing. Patient has NKDA to antibiotics. She denies a history of DM.    Past Medical History  Diagnosis Date  . Chronic back pain   . Hypertension   . Arthritis   . Blood transfusion   . Hyperlipidemia   . Neuromuscular disorder (HCC)     spasms in legs   Past Surgical History  Procedure Laterality Date  . Back surgery    . Cesarean section    . Trigger finger release     . Colonoscopy    . Polypectomy     Family History  Problem Relation Age of Onset  . Colon cancer Neg Hx   . Esophageal cancer Neg Hx   . Stomach cancer Neg Hx   . Rectal cancer Neg Hx    Social History  Substance Use Topics  . Smoking status: Never Smoker   . Smokeless tobacco: None  . Alcohol Use: No   OB History    No data available     Review of Systems  HENT: Negative for facial swelling.   Respiratory: Negative for shortness of breath.   Skin: Positive for color change and rash.  all other systems negative    Allergies  Hydrocodone-acetaminophen  Home Medications   Prior to Admission medications   Medication Sig Start Date End Date Taking? Authorizing Provider  cephALEXin (KEFLEX) 250 MG capsule Take 1 capsule (250 mg total) by mouth 4 (four) times daily. 03/29/15   Hope Bunnie Pion, NP  famotidine (PEPCID) 20 MG tablet Take 1 tablet (20 mg total) by mouth 2 (two) times daily. 03/29/15   Hope Bunnie Pion, NP  hydrochlorothiazide (HYDRODIURIL) 25 MG tablet Take 25 mg by mouth daily.    Historical Provider, MD  hydrOXYzine (ATARAX/VISTARIL) 25 MG tablet Take 1 tablet (25 mg total) by mouth every 6 (six) hours. 03/29/15   Hope Bunnie Pion, NP  ibuprofen (ADVIL,MOTRIN) 200 MG tablet Take 800 mg by mouth every 6 (six)  hours as needed for pain.    Historical Provider, MD  loratadine (CLARITIN) 10 MG tablet Take 1 tablet (10 mg total) by mouth daily. 03/29/15   Hope Bunnie Pion, NP  metFORMIN (GLUMETZA) 500 MG (MOD) 24 hr tablet Take 1,000 mg by mouth 2 (two) times daily with a meal.    Historical Provider, MD  oxyCODONE-acetaminophen (PERCOCET/ROXICET) 5-325 MG per tablet Take 1 tablet by mouth every 4 (four) hours as needed for pain. 05/08/12   Tiffany Carlota Raspberry, PA-C   BP 155/88 mmHg  Pulse 86  Temp(Src) 98.1 F (36.7 C) (Oral)  Resp 18  Ht 5\' 2"  (1.575 m)  Wt 86.183 kg  BMI 34.74 kg/m2  SpO2 96% Physical Exam  Constitutional: She is oriented to person, place, and time. She appears  well-developed and well-nourished. No distress.  HENT:  Head: Normocephalic and atraumatic.  Eyes: Conjunctivae and EOM are normal.  Neck: Neck supple. No tracheal deviation present.  Cardiovascular: Normal rate.   Pulmonary/Chest: Effort normal. No respiratory distress.  Musculoskeletal: Normal range of motion.  RUE: There is a 3 cm area of erythema to the right forearm, just below the elbow. There is a red streak noted. There is also a 2 cm area at the elbow that is tender, with erythema, with a blister at the center.  LUE: On the lateral aspect of the left forearm is a 1 cm area of erythema.  Radial pulses are 2+. Adequate circulation. Full ROM.  Neurological: She is alert and oriented to person, place, and time.  Skin: Skin is warm and dry.  Psychiatric: She has a normal mood and affect. Her behavior is normal.  Nursing note and vitals reviewed.   ED Course  Procedures (including critical care time)  DIAGNOSTIC STUDIES: Oxygen Saturation is 96% on RA, normal by my interpretation.    COORDINATION OF CARE: 5:38 PM - Discussed treatment plan with pt at bedside which includes Rx Atarax and Claritin. Cautioned pt about sedating side effects of medication. Pt instructed to return to the ED immediately if she notices red streaking, fever, or increased swelling. Pt verbalized understanding and agreed to plan.   MDM  60 y.o. female with tender red areas to the right and left forearm stable for d/c without fever and does not appear toxic. Will treat for early cellulitis and she will return for worsening symptoms.  Discussed with the patient plan of care and all questioned fully answered.   Final diagnoses:  Insect bite, infected    I personally performed the services described in this documentation, which was scribed in my presence. The recorded information has been reviewed and is accurate.     [place], NP 03/29/15 2117  Francine Graven, DO 03/31/15 1244

## 2015-03-31 ENCOUNTER — Emergency Department (HOSPITAL_COMMUNITY)
Admission: EM | Admit: 2015-03-31 | Discharge: 2015-03-31 | Disposition: A | Discharge status: Home / Self Care | Payer: Commercial Managed Care - HMO | Attending: Emergency Medicine | Admitting: Emergency Medicine

## 2015-03-31 ENCOUNTER — Encounter (HOSPITAL_COMMUNITY): Payer: Self-pay | Admitting: Emergency Medicine

## 2015-03-31 DIAGNOSIS — Z792 Long term (current) use of antibiotics: Secondary | ICD-10-CM | POA: Diagnosis not present

## 2015-03-31 DIAGNOSIS — S80862D Insect bite (nonvenomous), left lower leg, subsequent encounter: Secondary | ICD-10-CM | POA: Diagnosis not present

## 2015-03-31 DIAGNOSIS — S40862D Insect bite (nonvenomous) of left upper arm, subsequent encounter: Secondary | ICD-10-CM | POA: Diagnosis not present

## 2015-03-31 DIAGNOSIS — G8929 Other chronic pain: Secondary | ICD-10-CM | POA: Diagnosis not present

## 2015-03-31 DIAGNOSIS — Z7984 Long term (current) use of oral hypoglycemic drugs: Secondary | ICD-10-CM | POA: Insufficient documentation

## 2015-03-31 DIAGNOSIS — S50861D Insect bite (nonvenomous) of right forearm, subsequent encounter: Secondary | ICD-10-CM | POA: Insufficient documentation

## 2015-03-31 DIAGNOSIS — W57XXXA Bitten or stung by nonvenomous insect and other nonvenomous arthropods, initial encounter: Secondary | ICD-10-CM

## 2015-03-31 DIAGNOSIS — W57XXXD Bitten or stung by nonvenomous insect and other nonvenomous arthropods, subsequent encounter: Secondary | ICD-10-CM | POA: Diagnosis not present

## 2015-03-31 DIAGNOSIS — L03113 Cellulitis of right upper limb: Secondary | ICD-10-CM | POA: Diagnosis not present

## 2015-03-31 DIAGNOSIS — Z8669 Personal history of other diseases of the nervous system and sense organs: Secondary | ICD-10-CM | POA: Diagnosis not present

## 2015-03-31 DIAGNOSIS — R21 Rash and other nonspecific skin eruption: Secondary | ICD-10-CM

## 2015-03-31 DIAGNOSIS — I10 Essential (primary) hypertension: Secondary | ICD-10-CM | POA: Insufficient documentation

## 2015-03-31 DIAGNOSIS — S1086XD Insect bite of other specified part of neck, subsequent encounter: Secondary | ICD-10-CM | POA: Diagnosis not present

## 2015-03-31 DIAGNOSIS — L03818 Cellulitis of other sites: Secondary | ICD-10-CM

## 2015-03-31 DIAGNOSIS — S80861D Insect bite (nonvenomous), right lower leg, subsequent encounter: Secondary | ICD-10-CM | POA: Insufficient documentation

## 2015-03-31 DIAGNOSIS — Z79899 Other long term (current) drug therapy: Secondary | ICD-10-CM | POA: Insufficient documentation

## 2015-03-31 DIAGNOSIS — M1990 Unspecified osteoarthritis, unspecified site: Secondary | ICD-10-CM | POA: Insufficient documentation

## 2015-03-31 DIAGNOSIS — Z8639 Personal history of other endocrine, nutritional and metabolic disease: Secondary | ICD-10-CM | POA: Insufficient documentation

## 2015-03-31 MED ORDER — PREDNISONE 20 MG PO TABS: 40.0000 mg | ORAL_TABLET | Freq: Every day | ORAL | Status: DC

## 2015-03-31 MED ORDER — DEXAMETHASONE SODIUM PHOSPHATE 10 MG/ML IJ SOLN
10.0000 mg | Freq: Once | INTRAMUSCULAR | Status: AC
  Administered 2015-03-31: 10 mg via INTRAMUSCULAR
  Filled 2015-03-31: qty 1

## 2015-03-31 MED ORDER — SULFAMETHOXAZOLE-TRIMETHOPRIM 800-160 MG PO TABS: 1.0000 | ORAL_TABLET | Freq: Two times a day (BID) | ORAL | Status: AC

## 2015-03-31 MED ORDER — HYDROCORTISONE 1 % EX CREA: TOPICAL_CREAM | CUTANEOUS | Status: DC

## 2015-03-31 NOTE — Discharge Instructions (Signed)
Take medications as prescribed. Return to the emergency room for worsening condition or new concerning symptoms. Follow up with your regular doctor. If you don't have a regular doctor use one of the numbers below to establish a primary care doctor. ° ° °Emergency Department Resource Guide °1) Find a Doctor and Pay Out of Pocket °Although you won't have to find out who is covered by your insurance plan, it is a good idea to ask around and get recommendations. You will then need to call the office and see if the doctor you have chosen will accept you as a new patient and what types of options they offer for patients who are self-pay. Some doctors offer discounts or will set up payment plans for their patients who do not have insurance, but you will need to ask so you aren't surprised when you get to your appointment. ° °2) Contact Your Local Health Department °Not all health departments have doctors that can see patients for sick visits, but many do, so it is worth a call to see if yours does. If you don't know where your local health department is, you can check in your phone book. The CDC also has a tool to help you locate your state's health department, and many state websites also have listings of all of their local health departments. ° °3) Find a Walk-in Clinic °If your illness is not likely to be very severe or complicated, you may want to try a walk in clinic. These are popping up all over the country in pharmacies, drugstores, and shopping centers. They're usually staffed by nurse practitioners or physician assistants that have been trained to treat common illnesses and complaints. They're usually fairly quick and inexpensive. However, if you have serious medical issues or chronic medical problems, these are probably not your best option. ° °No Primary Care Doctor: °- Call [facility] at  832-8000 - they can help you locate a primary care doctor that  accepts your insurance, provides certain services,  etc. °- Physician Referral Service- 1-800-533-3463 ° °Emergency Department Resource Guide °1) Find a Doctor and Pay Out of Pocket °Although you won't have to find out who is covered by your insurance plan, it is a good idea to ask around and get recommendations. You will then need to call the office and see if the doctor you have chosen will accept you as a new patient and what types of options they offer for patients who are self-pay. Some doctors offer discounts or will set up payment plans for their patients who do not have insurance, but you will need to ask so you aren't surprised when you get to your appointment. ° °2) Contact Your Local Health Department °Not all health departments have doctors that can see patients for sick visits, but many do, so it is worth a call to see if yours does. If you don't know where your local health department is, you can check in your phone book. The CDC also has a tool to help you locate your state's health department, and many state websites also have listings of all of their local health departments. ° °3) Find a Walk-in Clinic °If your illness is not likely to be very severe or complicated, you may want to try a walk in clinic. These are popping up all over the country in pharmacies, drugstores, and shopping centers. They're usually staffed by nurse practitioners or physician assistants that have been trained to treat common illnesses and complaints. They're usually fairly   quick and inexpensive. However, if you have serious medical issues or chronic medical problems, these are probably not your best option. ° °No Primary Care Doctor: °- Call [facility] at  832-8000 - they can help you locate a primary care doctor that  accepts your insurance, provides certain services, etc. °- Physician Referral Service- 1-800-533-3463 ° °Chronic Pain Problems: °Organization         Address  Phone   Notes  ° [facility]  ([phone]) [phone] Patients need to be referred by  their primary care doctor.  ° °Medication Assistance: °Organization         Address  Phone   Notes  °[place] [facility] [address] °[place], [place] [address] (336) 641-8030 --Must be a resident of [place] °-- Must have NO insurance coverage whatsoever (no Medicaid/ Medicare, etc.) °-- The pt. MUST have a primary care doctor that directs their care regularly and follows them in the community °  °[facility]  (866) 331-1348   °[employer]  (888) 892-1162   ° °Agencies that provide inexpensive medical care: °Organization         Address  Phone   Notes  °[facility]  (336) 832-8035   °[facility]    (336) 832-7272   °[facility] [address] °[place], [place] [address] (336) 832-4777   °[facility] [address], °[place] (336) 271-4999   °[facility]    (336) 373-0678   °[facility]    (336) 272-1050   °[facility] ° [address], [place] Phone:  (336) 832-4444, Fax:  (336) [phone] Hours of Operation:  9 am - 6 pm, M-F.  Also accepts Medicaid/Medicare and self-pay.  °[facility] ° [address], [place] Phone: (336) [phone], Fax: (336) [phone]. Hours of Operation:  8:30 am - 5:30 pm, M-F.  Also accepts Medicaid and self-pay.  °[facility] [place] [address], [place] Phone: (336) [phone]   °[facility] [address], [place], [place] (336)723-1848, Ext. 123 Mondays & Thursdays: 7-9 AM.  First 15 patients are seen on a first come, first serve basis. °  ° °Medicaid-accepting [place] Providers: ° °Organization         Address  Phone   Notes  °[facility] [address], [place] ([phone]) [phone] Also accepts self-pay patients.  °[facility] [address], [place] ° (336) [phone]   °[facility]  [address], [place] (336) [phone]   °[facility] [address] [place] [address], [place] (336) [phone]   °[facility] [address], [place]  ° (336) [phone] Only accepts [place] Access Medicaid patients after they have their name applied to their card.  ° °Self-Pay (no insurance) in [place]: ° °Organization         Address  Phone   Notes  °Sickle Cell Patients, [facility] [address], [place] (336) [phone]   °[facility] [address], [place] (336) 832-4400   ° [facility] ° [address], [place] (336) 992-4800   °[facility]/Dr. Osei-Bonsu ° [address] [place] [address], [place] or [address], [place] ([phone]) [phone] Phone number   for both [place] and [place] locations is the same.  °[facility] [address], [place] (336) [phone]   °[facility] [address] [place] [address], [place] or [address] (336) 852-7530 °(336) 878-2260   °[facility] [address], [place] (336) 350-1642, phone; (336) [phone], fax Sees patients 1st and 3rd Saturday of every month.  Must not qualify for public or private insurance (i.e. Medicaid, Medicare, [place] Health Choice, Veterans' Benefits) • Household income should be no more than 200% of the poverty level •The clinic cannot treat you if you are pregnant or think you are pregnant • Sexually transmitted diseases are not treated at the clinic.  ° ° ° °

## 2015-03-31 NOTE — ED Notes (Signed)
Pt had an insect bite last week. Turned into a rash that spread across body. Was seen at [facility] and given medication without improvement. Original bite has now formed a small blister to right arm and pt complaining of itching all over.

## 2015-03-31 NOTE — ED Provider Notes (Signed)
CSN: [id number]     Arrival date & time 03/31/15  [id number] History   First MD Initiated Contact with Patient 03/31/15 1010     Chief Complaint  Patient presents with  . Insect Bite  . Rash  . Blister    HPI   Raven Mclean is an 60 y.o. female who presents to the ED for evaluation of rash/insect bites. She was seen at [facility] a few days ago after initial bites occurred while she was gardening. She was prescribed antihistamines for itching and covered with keflex as there was concern for possible cellulitis. She states the rash is not getting better. She states there are still areas of erythema on her arms, back, legs. The patches are itchy. She states the spots on her right forearm had some blisters form with purulent discharge that drained spontaneously. She states the lesions are not painful. She denies throat swelling, drooling, dysphagia, trismus. Denies difficulty swallowing. She states she has not done further gardening, no new exposures, no new lotions/products.   Past Medical History  Diagnosis Date  . Chronic back pain   . Hypertension   . Arthritis   . Blood transfusion   . Hyperlipidemia   . Neuromuscular disorder (HCC)     spasms in legs   Past Surgical History  Procedure Laterality Date  . Back surgery    . Cesarean section    . Trigger finger release    . Colonoscopy    . Polypectomy     Family History  Problem Relation Age of Onset  . Colon cancer Neg Hx   . Esophageal cancer Neg Hx   . Stomach cancer Neg Hx   . Rectal cancer Neg Hx    Social History  Substance Use Topics  . Smoking status: Never Smoker   . Smokeless tobacco: None  . Alcohol Use: No   OB History    No data available     Review of Systems  All other systems reviewed and are negative.     Allergies  Hydrocodone-acetaminophen  Home Medications   Prior to Admission medications   Medication Sig Start Date End Date Taking? Authorizing Provider  cephALEXin (KEFLEX) 250 MG capsule Take 1  capsule (250 mg total) by mouth 4 (four) times daily. 03/29/15   Hope Bunnie Pion, NP  famotidine (PEPCID) 20 MG tablet Take 1 tablet (20 mg total) by mouth 2 (two) times daily. 03/29/15   Hope Bunnie Pion, NP  hydrochlorothiazide (HYDRODIURIL) 25 MG tablet Take 25 mg by mouth daily.    Historical Provider, MD  hydrOXYzine (ATARAX/VISTARIL) 25 MG tablet Take 1 tablet (25 mg total) by mouth every 6 (six) hours. 03/29/15   Hope Bunnie Pion, NP  ibuprofen (ADVIL,MOTRIN) 200 MG tablet Take 800 mg by mouth every 6 (six) hours as needed for pain.    Historical Provider, MD  loratadine (CLARITIN) 10 MG tablet Take 1 tablet (10 mg total) by mouth daily. 03/29/15   Hope Bunnie Pion, NP  metFORMIN (GLUMETZA) 500 MG (MOD) 24 hr tablet Take 1,000 mg by mouth 2 (two) times daily with a meal.    Historical Provider, MD  oxyCODONE-acetaminophen (PERCOCET/ROXICET) 5-325 MG per tablet Take 1 tablet by mouth every 4 (four) hours as needed for pain. 05/08/12   Tiffany Carlota Raspberry, PA-C   BP 149/89 mmHg  Pulse 70  Temp(Src) 97.5 F (36.4 C) (Oral)  Resp 18  SpO2 98% Physical Exam  Constitutional: She is oriented to person, place, and time. No  distress.  HENT:  Head: Atraumatic.  Right Ear: External ear normal.  Left Ear: External ear normal.  Nose: Nose normal.  Mouth/Throat: Uvula is midline, oropharynx is clear and moist and mucous membranes are normal. No oral lesions. No trismus in the jaw.  Eyes: Conjunctivae are normal. No scleral icterus.  Neck: Normal range of motion. Neck supple.  Cardiovascular: Normal rate and regular rhythm.   Pulmonary/Chest: Effort normal. No respiratory distress. She exhibits no tenderness.  Abdominal: Soft. She exhibits no distension. There is no tenderness.  Neurological: She is alert and oriented to person, place, and time.  Skin: Skin is warm and dry. She is not diaphoretic.  Right forearm with several 1-2cm macular areas with central vesicle/pustule that has ruptured. Nontender. No streaking up  arm.  Left arm and bilateral shins with smaller, 0.5-1cm macular erythematous areas. Nontender. Some excoriation marks.  No trunk or back lesions.   Two 1cm erythematous uticarial lesions on left lateral neck. No vesicles or pustules. No excoriation marks. Nontender.   Psychiatric: She has a normal mood and affect. Her behavior is normal.  Nursing note and vitals reviewed.   ED Course  Procedures (including critical care time) Labs Review Labs Reviewed - No data to display  Imaging Review No results found. I have personally reviewed and evaluated these images and lab results as part of my medical decision-making.   EKG Interpretation None      MDM   Final diagnoses:  Insect bite  Rash  Cellulitis of other specified site    Given h/o pustular lesion on right forearm (has ruptured), will change abx coverage to Bactrim. Decadron given in the ED with rx for several more days of prednisone. Pt has Pepcid and Claritin which she has been taking; I instructed her to continue taking. Rx for hydrocortisone cream given as well. Pt is afebrile, no tachycardia or hypoxia. No increased WOB or tachypnea, no angioedema. I am not concerned about serious systemic infection or allergic reaction. Instructed close f/u with PCP and strict ER return precautions. Pt verbalized her agreement and understanding.     Anne Ng, PA-C 03/31/15 [place], MD 04/01/15 [phone]

## 2015-11-20 ENCOUNTER — Other Ambulatory Visit: Payer: Self-pay | Admitting: Internal Medicine

## 2015-11-20 DIAGNOSIS — Z1231 Encounter for screening mammogram for malignant neoplasm of breast: Secondary | ICD-10-CM

## 2015-12-17 ENCOUNTER — Ambulatory Visit
Admission: RE | Admit: 2015-12-17 | Discharge: 2015-12-17 | Disposition: A | Discharge status: Home / Self Care | Payer: Commercial Managed Care - HMO | Source: Ambulatory Visit | Attending: Internal Medicine | Admitting: Internal Medicine

## 2015-12-17 DIAGNOSIS — Z1231 Encounter for screening mammogram for malignant neoplasm of breast: Secondary | ICD-10-CM

## 2015-12-30 ENCOUNTER — Other Ambulatory Visit: Payer: Self-pay | Admitting: Internal Medicine

## 2015-12-30 DIAGNOSIS — E2839 Other primary ovarian failure: Secondary | ICD-10-CM

## 2016-02-15 ENCOUNTER — Encounter: Payer: Self-pay | Admitting: Gastroenterology

## 2016-02-24 ENCOUNTER — Encounter: Payer: Self-pay | Admitting: Gastroenterology

## 2016-04-12 ENCOUNTER — Ambulatory Visit (AMBULATORY_SURGERY_CENTER): Payer: Self-pay

## 2016-04-12 VITALS — Ht 62.0 in | Wt 210.8 lb

## 2016-04-12 DIAGNOSIS — Z860100 Personal history of colon polyps, unspecified: Secondary | ICD-10-CM

## 2016-04-12 MED ORDER — SUPREP BOWEL PREP KIT 17.5-3.13-1.6 GM/177ML PO SOLN: 1.0000 | Freq: Once | ORAL | 0 refills | Status: AC

## 2016-04-12 NOTE — Progress Notes (Signed)
No allergies to eggs or soy No past problems with anesthesia No home oxygen HAS PHENTERMINE RX BUT HASNT STARTED  DECLINED EMMI

## 2016-04-13 ENCOUNTER — Encounter: Payer: Self-pay | Admitting: Gastroenterology

## 2016-04-26 ENCOUNTER — Ambulatory Visit (AMBULATORY_SURGERY_CENTER): Payer: Medicare HMO | Admitting: Gastroenterology

## 2016-04-26 ENCOUNTER — Encounter: Payer: Self-pay | Admitting: Gastroenterology

## 2016-04-26 VITALS — BP 151/71 | HR 75 | Temp 97.1°F | Resp 17 | Ht 62.0 in | Wt 210.0 lb

## 2016-04-26 DIAGNOSIS — D122 Benign neoplasm of ascending colon: Secondary | ICD-10-CM | POA: Diagnosis not present

## 2016-04-26 DIAGNOSIS — D120 Benign neoplasm of cecum: Secondary | ICD-10-CM

## 2016-04-26 DIAGNOSIS — D125 Benign neoplasm of sigmoid colon: Secondary | ICD-10-CM | POA: Diagnosis not present

## 2016-04-26 DIAGNOSIS — Z86010 Personal history of colonic polyps: Secondary | ICD-10-CM

## 2016-04-26 MED ORDER — SODIUM CHLORIDE 0.9 % IV SOLN: 500.0000 mL | INTRAVENOUS | Status: DC

## 2016-04-26 NOTE — Progress Notes (Signed)
Called to room to assist during endoscopic procedure.  Patient ID and intended procedure confirmed with present staff. Received instructions for my participation in the procedure from the performing physician.  

## 2016-04-26 NOTE — Op Note (Signed)
[facility] Patient Name: Raven Mclean Procedure Date: 04/26/2016 8:57 AM MRN: [id number] Endoscopist: Milus Banister , MD Age: 61 Referring MD:  Date of Birth: 01-Aug-1955 Gender: Female Account #: [id number] Procedure:                Colonoscopy Indications:              High risk colon cancer surveillance: Personal                            history of colonic polyps; colonoscopy 2013 Dr.                            Ardis Hughs no polyps, colonoscopy [facility] 2008 +polyps per                            patient Medicines:                Monitored Anesthesia Care Procedure:                Pre-Anesthesia Assessment:                           - Prior to the procedure, a History and Physical                            was performed, and patient medications and                            allergies were reviewed. The patient's tolerance of                            previous anesthesia was also reviewed. The risks                            and benefits of the procedure and the sedation                            options and risks were discussed with the patient.                            All questions were answered, and informed consent                            was obtained. Prior Anticoagulants: The patient has                            taken no previous anticoagulant or antiplatelet                            agents. ASA Grade Assessment: II - A patient with                            mild systemic disease. After reviewing the risks  and benefits, the patient was deemed in                            satisfactory condition to undergo the procedure.                           After obtaining informed consent, the colonoscope                            was passed under direct vision. Throughout the                            procedure, the patient's blood pressure, pulse, and                            oxygen saturations were monitored continuously. The                        Colonoscope was introduced through the anus and                            advanced to the the cecum, identified by                            appendiceal orifice and ileocecal valve. The                            colonoscopy was performed without difficulty. The                            patient tolerated the procedure well. The quality                            of the bowel preparation was excellent. The                            ileocecal valve, appendiceal orifice, and rectum                            were photographed. Scope In: 9:01:25 AM Scope Out: 9:16:22 AM Scope Withdrawal Time: 0 hours 12 minutes 17 seconds  Total Procedure Duration: 0 hours 14 minutes 57 seconds  Findings:                 Three sessile polyps were found in the sigmoid                            colon, transverse colon and ascending colon. The                            polyps were 2 to 3 mm in size. These polyps were                            removed with a cold snare. Resection and retrieval  were complete.                           The exam was otherwise without abnormality on                            direct and retroflexion views. Complications:            No immediate complications. Estimated blood loss:                            None. Estimated Blood Loss:     Estimated blood loss: none. Impression:               - Three 2 to 3 mm polyps in the sigmoid colon, in                            the transverse colon and in the ascending colon,                            removed with a cold snare. Resected and retrieved.                           - The examination was otherwise normal on direct                            and retroflexion views. Recommendation:           - Patient has a contact number available for                            emergencies. The signs and symptoms of potential                            delayed complications were discussed with  the                            patient. Return to normal activities tomorrow.                            Written discharge instructions were provided to the                            patient.                           - Resume previous diet.                           - Continue present medications.                           You will receive a letter within 2-3 weeks with the                            pathology results and my final recommendations.  If the polyp(s) is proven to be 'pre-cancerous' on                            pathology, you will need repeat colonoscopy in 3-5                            years. If the polyp(s) is NOT 'precancerous' on                            pathology then you should repeat colon cancer                            screening in 10 years with colonoscopy without need                            for colon cancer screening by any method prior to                            then (including stool testing). Milus Banister, MD 04/26/2016 9:18:54 AM This report has been signed electronically.

## 2016-04-26 NOTE — Progress Notes (Signed)
Pt's states no medical or surgical changes since previsit or office visit. 

## 2016-04-26 NOTE — Patient Instructions (Signed)
YOU HAD AN ENDOSCOPIC PROCEDURE TODAY AT [facility]:   Refer to the procedure report that was given to you for any specific questions about what was found during the examination.  If the procedure report does not answer your questions, please call your gastroenterologist to clarify.  If you requested that your care partner not be given the details of your procedure findings, then the procedure report has been included in a sealed envelope for you to review at your convenience later.  YOU SHOULD EXPECT: Some feelings of bloating in the abdomen. Passage of more gas than usual.  Walking can help get rid of the air that was put into your GI tract during the procedure and reduce the bloating. If you had a lower endoscopy (such as a colonoscopy or flexible sigmoidoscopy) you may notice spotting of blood in your stool or on the toilet paper. If you underwent a bowel prep for your procedure, you may not have a normal bowel movement for a few days.  Please Note:  You might notice some irritation and congestion in your nose or some drainage.  This is from the oxygen used during your procedure.  There is no need for concern and it should clear up in a day or so.  SYMPTOMS TO REPORT IMMEDIATELY:   Following lower endoscopy (colonoscopy or flexible sigmoidoscopy):  Excessive amounts of blood in the stool  Significant tenderness or worsening of abdominal pains  Swelling of the abdomen that is new, acute  Fever of 100F or higher    For urgent or emergent issues, a gastroenterologist can be reached at any hour by calling [phone].   DIET:  We do recommend a small meal at first, but then you may proceed to your regular diet.  Drink plenty of fluids but you should avoid alcoholic beverages for 24 hours.  ACTIVITY:  You should plan to take it easy for the rest of today and you should NOT DRIVE or use heavy machinery until tomorrow (because of the sedation medicines used during the test).     FOLLOW UP: Our staff will call the number listed on your records the next business day following your procedure to check on you and address any questions or concerns that you may have regarding the information given to you following your procedure. If we do not reach you, we will leave a message.  However, if you are feeling well and you are not experiencing any problems, there is no need to return our call.  We will assume that you have returned to your regular daily activities without incident.  If any biopsies were taken you will be contacted by phone or by letter within the next 1-3 weeks.  Please call us at [phone] if you have not heard about the biopsies in 3 weeks.   Await for biopsy results to determined next repeat Colonoscopy Screening Polyps (handout given)   SIGNATURES/CONFIDENTIALITY: You and/or your care partner have signed paperwork which will be entered into your electronic medical record.  These signatures attest to the fact that that the information above on your After Visit Summary has been reviewed and is understood.  Full responsibility of the confidentiality of this discharge information lies with you and/or your care-partner.

## 2016-04-26 NOTE — Progress Notes (Signed)
Report to PACU, RN, vss, BBS= Clear.  

## 2016-04-27 ENCOUNTER — Telehealth: Payer: Self-pay

## 2016-04-27 NOTE — Telephone Encounter (Signed)
  Follow up Call-  Call back number 04/26/2016  Post procedure Call Back phone  # [phone]  Permission to leave phone message Yes  Some recent data might be hidden     Patient questions:  Do you have a fever, pain , or abdominal swelling? No. Pain Score  0 *  Have you tolerated food without any problems? Yes.    Have you been able to return to your normal activities? Yes.    Do you have any questions about your discharge instructions: Diet   No. Medications  No. Follow up visit  No.  Do you have questions or concerns about your Care? No.  Actions: * If pain score is 4 or above: No action needed, pain <4.

## 2016-05-06 ENCOUNTER — Encounter: Payer: Self-pay | Admitting: Gastroenterology

## 2017-03-16 ENCOUNTER — Other Ambulatory Visit: Payer: Self-pay | Admitting: Internal Medicine

## 2017-03-16 DIAGNOSIS — E2839 Other primary ovarian failure: Secondary | ICD-10-CM

## 2017-03-16 DIAGNOSIS — Z1231 Encounter for screening mammogram for malignant neoplasm of breast: Secondary | ICD-10-CM

## 2017-04-04 ENCOUNTER — Ambulatory Visit
Admission: RE | Admit: 2017-04-04 | Discharge: 2017-04-04 | Disposition: A | Discharge status: Home / Self Care | Payer: Medicare HMO | Source: Ambulatory Visit | Attending: Internal Medicine | Admitting: Internal Medicine

## 2017-04-04 DIAGNOSIS — Z1231 Encounter for screening mammogram for malignant neoplasm of breast: Secondary | ICD-10-CM

## 2017-04-04 DIAGNOSIS — E2839 Other primary ovarian failure: Secondary | ICD-10-CM

## 2017-12-20 ENCOUNTER — Ambulatory Visit (INDEPENDENT_AMBULATORY_CARE_PROVIDER_SITE_OTHER): Payer: Self-pay | Admitting: Orthopaedic Surgery

## 2018-04-10 ENCOUNTER — Other Ambulatory Visit: Payer: Self-pay | Admitting: Internal Medicine

## 2018-04-10 DIAGNOSIS — Z1231 Encounter for screening mammogram for malignant neoplasm of breast: Secondary | ICD-10-CM

## 2018-04-11 ENCOUNTER — Ambulatory Visit
Admission: RE | Admit: 2018-04-11 | Discharge: 2018-04-11 | Disposition: A | Discharge status: Home / Self Care | Payer: Medicare HMO | Source: Ambulatory Visit | Attending: Internal Medicine | Admitting: Internal Medicine

## 2018-04-11 ENCOUNTER — Other Ambulatory Visit: Payer: Self-pay

## 2018-04-11 DIAGNOSIS — Z1231 Encounter for screening mammogram for malignant neoplasm of breast: Secondary | ICD-10-CM

## 2019-03-15 ENCOUNTER — Other Ambulatory Visit: Payer: Self-pay | Admitting: Internal Medicine

## 2019-03-15 DIAGNOSIS — Z1231 Encounter for screening mammogram for malignant neoplasm of breast: Secondary | ICD-10-CM

## 2019-03-18 ENCOUNTER — Ambulatory Visit
Admission: RE | Admit: 2019-03-18 | Discharge: 2019-03-18 | Disposition: A | Discharge status: Home / Self Care | Payer: Medicare HMO | Source: Ambulatory Visit | Attending: Internal Medicine | Admitting: Internal Medicine

## 2019-03-18 ENCOUNTER — Other Ambulatory Visit: Payer: Self-pay

## 2019-03-18 DIAGNOSIS — Z1231 Encounter for screening mammogram for malignant neoplasm of breast: Secondary | ICD-10-CM

## 2019-04-06 ENCOUNTER — Ambulatory Visit: Payer: Medicare HMO | Attending: Internal Medicine

## 2019-04-06 DIAGNOSIS — Z23 Encounter for immunization: Secondary | ICD-10-CM | POA: Insufficient documentation

## 2019-04-06 NOTE — Progress Notes (Signed)
   Covid-19 Vaccination Clinic  Name:  Raven Mclean    MRN: [id number] DOB: 1956-01-23  04/06/2019  [name] was observed post Covid-19 immunization for 15 minutes without incident. She was provided with Vaccine Information Sheet and instruction to access the V-Safe system.   [name] was instructed to call 911 with any severe reactions post vaccine: Marland Kitchen Difficulty breathing  . Swelling of face and throat  . A fast heartbeat  . A bad rash all over body  . Dizziness and weakness   Immunizations Administered    Name Date Dose VIS Date Route   Pfizer COVID-19 Vaccine 04/06/2019  8:53 AM 0.3 mL 01/11/2019 Intramuscular   Manufacturer: [place]   Lot: [id number]   [place]: [id number]

## 2019-04-27 ENCOUNTER — Ambulatory Visit: Payer: Medicare HMO | Attending: Internal Medicine

## 2019-04-27 DIAGNOSIS — Z23 Encounter for immunization: Secondary | ICD-10-CM

## 2019-04-27 NOTE — Progress Notes (Signed)
   Covid-19 Vaccination Clinic  Name:  Raven Mclean    MRN: [id number] DOB: May 11, 1955  04/27/2019  [name] was observed post Covid-19 immunization for 15 minutes without incident. She was provided with Vaccine Information Sheet and instruction to access the V-Safe system.   [name] was instructed to call 911 with any severe reactions post vaccine: Marland Kitchen Difficulty breathing  . Swelling of face and throat  . A fast heartbeat  . A bad rash all over body  . Dizziness and weakness   Immunizations Administered    Name Date Dose VIS Date Route   Pfizer COVID-19 Vaccine 04/27/2019  9:54 AM 0.3 mL 01/11/2019 Intramuscular   Manufacturer: [place]   Lot: [id number]   [place]: [id number]

## 2019-12-17 ENCOUNTER — Emergency Department (HOSPITAL_COMMUNITY): Payer: Medicare HMO

## 2019-12-17 ENCOUNTER — Other Ambulatory Visit: Payer: Self-pay

## 2019-12-17 ENCOUNTER — Emergency Department (HOSPITAL_COMMUNITY)
Admission: EM | Admit: 2019-12-17 | Discharge: 2019-12-17 | Disposition: A | Discharge status: Home / Self Care | Payer: Medicare HMO | Attending: Emergency Medicine | Admitting: Emergency Medicine

## 2019-12-17 ENCOUNTER — Encounter (HOSPITAL_COMMUNITY): Payer: Self-pay

## 2019-12-17 DIAGNOSIS — R932 Abnormal findings on diagnostic imaging of liver and biliary tract: Secondary | ICD-10-CM

## 2019-12-17 DIAGNOSIS — R1084 Generalized abdominal pain: Secondary | ICD-10-CM | POA: Diagnosis present

## 2019-12-17 DIAGNOSIS — I10 Essential (primary) hypertension: Secondary | ICD-10-CM | POA: Insufficient documentation

## 2019-12-17 DIAGNOSIS — Z79899 Other long term (current) drug therapy: Secondary | ICD-10-CM | POA: Insufficient documentation

## 2019-12-17 DIAGNOSIS — K8020 Calculus of gallbladder without cholecystitis without obstruction: Secondary | ICD-10-CM | POA: Diagnosis not present

## 2019-12-17 DIAGNOSIS — Z7982 Long term (current) use of aspirin: Secondary | ICD-10-CM | POA: Diagnosis not present

## 2019-12-17 LAB — CBC
HCT: 34.9 % — ABNORMAL LOW (ref 36.0–46.0)
Hemoglobin: 11 g/dL — ABNORMAL LOW (ref 12.0–15.0)
MCH: 32.3 pg (ref 26.0–34.0)
MCHC: 31.5 g/dL (ref 30.0–36.0)
MCV: 102.3 fL — ABNORMAL HIGH (ref 80.0–100.0)
Platelets: 387 10*3/uL (ref 150–400)
RBC: 3.41 MIL/uL — ABNORMAL LOW (ref 3.87–5.11)
RDW: 13.1 % (ref 11.5–15.5)
WBC: 4.3 10*3/uL (ref 4.0–10.5)
nRBC: 0 % (ref 0.0–0.2)

## 2019-12-17 LAB — URINALYSIS, ROUTINE W REFLEX MICROSCOPIC
Bilirubin Urine: NEGATIVE
Glucose, UA: NEGATIVE mg/dL
Hgb urine dipstick: NEGATIVE
Ketones, ur: NEGATIVE mg/dL
Leukocytes,Ua: NEGATIVE
Nitrite: NEGATIVE
Protein, ur: 30 mg/dL — AB
Specific Gravity, Urine: 1.019 (ref 1.005–1.030)
pH: 8 (ref 5.0–8.0)

## 2019-12-17 LAB — COMPREHENSIVE METABOLIC PANEL
ALT: 9 U/L (ref 0–44)
AST: 17 U/L (ref 15–41)
Albumin: 3.9 g/dL (ref 3.5–5.0)
Alkaline Phosphatase: 60 U/L (ref 38–126)
Anion gap: 12 (ref 5–15)
BUN: 10 mg/dL (ref 8–23)
CO2: 27 mmol/L (ref 22–32)
Calcium: 10.1 mg/dL (ref 8.9–10.3)
Chloride: 101 mmol/L (ref 98–111)
Creatinine, Ser: 0.8 mg/dL (ref 0.44–1.00)
GFR, Estimated: >60 mL/min (ref >60)
Glucose, Bld: 103 mg/dL — ABNORMAL HIGH (ref 70–99)
Potassium: 3.6 mmol/L (ref 3.5–5.1)
Sodium: 140 mmol/L (ref 135–145)
Total Bilirubin: 0.6 mg/dL (ref 0.3–1.2)
Total Protein: 7.2 g/dL (ref 6.5–8.1)

## 2019-12-17 LAB — LIPASE, BLOOD: Lipase: 37 U/L (ref 11–51)

## 2019-12-17 MED ORDER — MORPHINE SULFATE (PF) 4 MG/ML IV SOLN
4.0000 mg | Freq: Once | INTRAVENOUS | Status: AC
  Administered 2019-12-17: 4 mg via INTRAVENOUS
  Filled 2019-12-17: qty 1

## 2019-12-17 MED ORDER — ONDANSETRON HCL 4 MG/2ML IJ SOLN: 4.0000 mg | Freq: Once | INTRAMUSCULAR | Status: DC

## 2019-12-17 MED ORDER — ALUM & MAG HYDROXIDE-SIMETH 200-200-20 MG/5ML PO SUSP
30.0000 mL | Freq: Once | ORAL | Status: AC
  Administered 2019-12-17: 30 mL via ORAL
  Filled 2019-12-17: qty 30

## 2019-12-17 MED ORDER — SODIUM CHLORIDE 0.9 % IV BOLUS
1000.0000 mL | Freq: Once | INTRAVENOUS | Status: AC
  Administered 2019-12-17: 1000 mL via INTRAVENOUS

## 2019-12-17 MED ORDER — ONDANSETRON 4 MG PO TBDP
4.0000 mg | ORAL_TABLET | Freq: Once | ORAL | Status: AC
Start: 2019-12-17 — End: 2019-12-17
  Administered 2019-12-17: 4 mg via ORAL
  Filled 2019-12-17: qty 1

## 2019-12-17 MED ORDER — PROMETHAZINE HCL 25 MG/ML IJ SOLN
12.5000 mg | Freq: Once | INTRAMUSCULAR | Status: AC
  Administered 2019-12-17: 12.5 mg via INTRAVENOUS
  Filled 2019-12-17: qty 1

## 2019-12-17 MED ORDER — ONDANSETRON 4 MG PO TBDP: ORAL_TABLET | ORAL | 0 refills | Status: DC

## 2019-12-17 MED ORDER — METOCLOPRAMIDE HCL 5 MG/ML IJ SOLN
10.0000 mg | Freq: Once | INTRAMUSCULAR | Status: AC
  Administered 2019-12-17: 10 mg via INTRAVENOUS
  Filled 2019-12-17: qty 2

## 2019-12-17 MED ORDER — IOHEXOL 300 MG/ML  SOLN
100.0000 mL | Freq: Once | INTRAMUSCULAR | Status: AC | PRN
  Administered 2019-12-17: 100 mL via INTRAVENOUS

## 2019-12-17 MED ORDER — ONDANSETRON HCL 4 MG/2ML IJ SOLN
4.0000 mg | Freq: Once | INTRAMUSCULAR | Status: AC
  Administered 2019-12-17: 4 mg via INTRAVENOUS
  Filled 2019-12-17: qty 2

## 2019-12-17 MED ORDER — ALUM & MAG HYDROXIDE-SIMETH 400-400-40 MG/5ML PO SUSP: 15.0000 mL | Freq: Four times a day (QID) | ORAL | 0 refills | Status: AC | PRN

## 2019-12-17 MED ORDER — LIDOCAINE VISCOUS HCL 2 % MT SOLN
15.0000 mL | Freq: Once | OROMUCOSAL | Status: AC
  Administered 2019-12-17: 15 mL via ORAL
  Filled 2019-12-17: qty 15

## 2019-12-17 NOTE — ED Triage Notes (Signed)
Pt reports generalized abd pain and emesis since last Friday. Left knee replacement last month, no complications.

## 2019-12-17 NOTE — Discharge Instructions (Signed)
Your work-up today shows that you have gallstones, sometimes these can cause abdominal pain as well as severe nausea and vomiting.  Please follow the eating plan provided to you today to help limit the symptoms.  For the next day or so please stick to clear liquids and then slowly advance to bland solids.  Take Zofran regularly for the next day and then as needed for nausea, do not wait until you have started vomiting to take this medicine.  You can use your prescribed pain medication and you can use Maalox suspension every 6 hours as needed for pain and nausea.  Please call to schedule follow-up appointment with general surgery for management of gallstones, and call to schedule follow-up with your primary care provider.  Return if you develop fevers, worsening abdominal pain, persistent vomiting or any other new or concerning symptoms.

## 2019-12-17 NOTE — ED Notes (Signed)
Patient verbalizes understanding of discharge instructions. Opportunity for questioning and answers were provided. Armband removed by staff, pt discharged from ED. Pt. ambulatory and discharged home.  

## 2019-12-17 NOTE — ED Provider Notes (Signed)
[facility] EMERGENCY DEPARTMENT Provider Note   CSN: [id number] Arrival date & time: 12/17/19  1133     History Chief Complaint  Patient presents with  . Abdominal Pain  . Emesis    Raven Mclean is a 64 y.o. female.  Raven Mclean is a 63 y.o. female with a history of hypertension, hyperlipidemia, chronic back pain, and leg spasms, who presents to the emergency department for evaluation of abdominal pain, nausea and vomiting.  Patient had a total knee replacement on the right 3 weeks ago at [facility], did not have any immediate complications and was discharged home and has been completing rehab.  She has had a follow-up appointment and her knee appears to be healing well.  She was recovering well after her surgery until Friday when she began having upper abdominal pain and persistent nausea and vomiting.  Was not having issues with vomiting after her surgery, has been on pain medication and was prescribed Zofran with this.  Has been able to eat and drink without issue until Friday.  She states that she developed generalized abdominal pain that seems to come and go in persistent vomiting.  She states that if she tries to eat or drink anything she throws up and she has been having some dry heaving as well.  She was initially having some diarrhea but called her doctor's office and the nurse told her to stop using Colace that she was prescribed while on pain medication and she reports that diarrhea has stopped now.  She denies any blood in the stools.  No blood in her emesis.  No chest pain or shortness of breath.  No fevers or chills.  She has continued to have some mild swelling in her knee, but no redness or warmth, no fevers, no drainage from incision site.  Denies history of previous abdominal surgeries.  Has not had similar abdominal symptoms in the past that she can think of.  States that she has not been able to keep down her pain or nausea medicine at home.  No other  aggravating or alleviating factors.        Past Medical History:  Diagnosis Date  . Arthritis   . Blood transfusion   . Chronic back pain   . Hyperlipidemia   . Hypertension   . Neuromuscular disorder ([place])    spasms in legs    Patient Active Problem List   Diagnosis Date Noted  . Hypertension 03/15/2011  . Obesity, Class II, BMI 35-39.9, with comorbidity 03/15/2011  . Chronic back pain     Past Surgical History:  Procedure Laterality Date  . BACK SURGERY    . CESAREAN SECTION    . COLONOSCOPY    . POLYPECTOMY    . TRIGGER FINGER RELEASE       OB History   No obstetric history on file.     Family History  Problem Relation Age of Onset  . Heart disease Mother   . Heart disease Brother   . Colon cancer Neg Hx     Social History   Tobacco Use  . Smoking status: Never Smoker  . Smokeless tobacco: Never Used  Substance Use Topics  . Alcohol use: No  . Drug use: No    Home Medications Prior to Admission medications   Medication Sig Start Date End Date Taking? Authorizing Provider  acetaminophen (TYLENOL) 500 MG tablet Take 1,000 mg by mouth in the morning, at noon, and at bedtime.  Yes [provider]  Ascorbic Acid (VITAMIN C WITH ROSE HIPS) 500 MG tablet Take 500 mg by mouth daily.   Yes [provider]  aspirin 81 MG chewable tablet Chew 81 mg by mouth in the morning and at bedtime.   Yes [provider]  cholecalciferol (VITAMIN D3) 25 MCG (1000 UNIT) tablet Take 1,000 Units by mouth daily.   Yes [provider]  diclofenac Sodium (VOLTAREN) 1 % GEL Apply 2 g topically 4 (four) times daily.   Yes [provider]  docusate sodium (COLACE) 100 MG capsule Take 100 mg by mouth daily as needed for mild constipation.   Yes [provider]  hydrochlorothiazide (HYDRODIURIL) 25 MG tablet Take 25 mg by mouth daily.   Yes [provider]  lidocaine (LIDODERM) 5 % Place 1-3 patches onto the skin  daily. Remove & Discard patch within 12 hours or as directed by MD   Yes [provider]  metoprolol tartrate (LOPRESSOR) 50 MG tablet Take 50 mg by mouth daily.   Yes [provider]  Multiple Vitamins-Minerals (MULTIVITAMIN WITH MINERALS) tablet Take 1 tablet by mouth daily.   Yes [provider]  ondansetron (ZOFRAN) 4 MG tablet Take 4 mg by mouth every 8 (eight) hours as needed for nausea or vomiting.   Yes [provider]  oxycodone (OXY-IR) 5 MG capsule Take 5-10 mg by mouth every 4 (four) hours as needed for pain.   Yes [provider]  potassium chloride (KLOR-CON) 10 MEQ tablet Take 10 mEq by mouth daily.   Yes [provider]  simethicone (MYLICON) 80 MG chewable tablet Chew 80 mg by mouth every 6 (six) hours as needed for flatulence.   Yes [provider]  simvastatin (ZOCOR) 20 MG tablet Take 20 mg by mouth daily.   Yes [provider]  alum & mag hydroxide-simeth (MAALOX MAX) 400-400-40 MG/5ML suspension Take 15 mLs by mouth every 6 (six) hours as needed for indigestion. 12/17/19   Jacqlyn Larsen, PA-C  loratadine (CLARITIN) 10 MG tablet Take 1 tablet (10 mg total) by mouth daily. 03/29/15   Ashley Murrain, NP  ondansetron (ZOFRAN ODT) 4 MG disintegrating tablet 4mg  ODT q4 hours prn nausea/vomit 12/17/19   Jacqlyn Larsen, PA-C  Phendimetrazine Tartrate 35 MG TABS Take 35 mg by mouth in the morning, at noon, and at bedtime. Patient not taking: Reported on 12/17/2019    [provider]    Allergies    Hydrocodone-acetaminophen  Review of Systems   Review of Systems  Constitutional: Negative for chills and fever.  HENT: Negative.   Respiratory: Negative for cough and shortness of breath.   Cardiovascular: Negative for chest pain.  Gastrointestinal: Positive for abdominal pain, diarrhea, nausea and vomiting. Negative for blood in stool and constipation.  Genitourinary: Negative for dysuria and frequency.    Musculoskeletal: Positive for arthralgias and joint swelling.  Skin: Negative for color change and rash.  Neurological: Negative for dizziness, syncope and light-headedness.  All other systems reviewed and are negative.   Physical Exam Updated Vital Signs BP (!) 147/84   Pulse 83   Temp 99.4 F (37.4 C) (Oral)   Resp 18   SpO2 98%   Physical Exam Vitals and nursing note reviewed.  Constitutional:      General: She is not in acute distress.    Appearance: She is well-developed. She is not ill-appearing or diaphoretic.     Comments: Patient is alert, holding emesis  bag and having some dry heaving, appears uncomfortable but is in no acute distress  HENT:     Head: Normocephalic and atraumatic.  Eyes:     General:        Right eye: No discharge.        Left eye: No discharge.     Pupils: Pupils are equal, round, and reactive to light.  Cardiovascular:     Rate and Rhythm: Normal rate and regular rhythm.     Heart sounds: Normal heart sounds. No murmur heard.  No friction rub. No gallop.   Pulmonary:     Effort: Pulmonary effort is normal. No respiratory distress.     Breath sounds: Normal breath sounds. No wheezing or rales.     Comments: Respirations equal and unlabored, patient able to speak in full sentences, lungs clear to auscultation bilaterally Abdominal:     General: Bowel sounds are normal. There is no distension.     Palpations: Abdomen is soft. There is no mass.     Tenderness: There is generalized abdominal tenderness. There is no guarding.     Hernia: No hernia is present.     Comments: Abdomen is soft, nondistended, bowel sounds present throughout, there is generalized tenderness throughout the abdomen, primarily in the periumbilical region, there is no guarding or peritoneal signs  Musculoskeletal:        General: No deformity.     Cervical back: Neck supple.     Comments: Right knee with well-healing surgical incision, no erythema, mild swelling noted, no  drainage, patient is weightbearing as tolerated with walker, distal pulses 2+, no calf tenderness  Skin:    General: Skin is warm and dry.     Capillary Refill: Capillary refill takes less than 2 seconds.  Neurological:     Mental Status: She is alert.     Coordination: Coordination normal.     Comments: Speech is clear, able to follow commands Moves extremities without ataxia, coordination intact  Psychiatric:        Mood and Affect: Mood normal.        Behavior: Behavior normal.     ED Results / Procedures / Treatments   Labs (all labs ordered are listed, but only abnormal results are displayed) Labs Reviewed  COMPREHENSIVE METABOLIC PANEL - Abnormal; Notable for the following components:      Result Value   Glucose, Bld 103 (*)    All other components within normal limits  CBC - Abnormal; Notable for the following components:   RBC 3.41 (*)    Hemoglobin 11.0 (*)    HCT 34.9 (*)    MCV 102.3 (*)    All other components within normal limits  URINALYSIS, ROUTINE W REFLEX MICROSCOPIC - Abnormal; Notable for the following components:   APPearance HAZY (*)    Protein, ur 30 (*)    Bacteria, UA RARE (*)    All other components within normal limits  LIPASE, BLOOD    EKG EKG Interpretation  Date/Time:  Tuesday December 17 2019 14:00:37 EST Ventricular Rate:  84 PR Interval:    QRS Duration: 92 QT Interval:  371 QTC Calculation: 439 R Axis:   45 Text Interpretation: Sinus rhythm Confirmed by Quintella Reichert [phone]) on 12/17/2019 4:39:37 PM   Radiology CT ABDOMEN PELVIS W CONTRAST  Result Date: 12/17/2019 CLINICAL DATA:  Abdominal pain and emesis. EXAM: CT ABDOMEN AND PELVIS WITH CONTRAST TECHNIQUE: Multidetector CT imaging of the abdomen and pelvis was performed using the standard  protocol following bolus administration of intravenous contrast. CONTRAST:  191mL OMNIPAQUE IOHEXOL 300 MG/ML  SOLN COMPARISON:  None. FINDINGS: Lower chest: Bibasilar dependent atelectasis.  Mild cardiomegaly, without pericardial or pleural effusion. Hepatobiliary: Segment 4 B 1.3 cm well-circumscribed low-density lesion, favoring a cyst or minimally complex cyst. Stone filled gallbladder which is mildly distended. No specific evidence of acute cholecystitis or biliary duct dilatation. Pancreas: Normal, without mass or ductal dilatation. Spleen: Normal in size, without focal abnormality. Adrenals/Urinary Tract: Normal adrenal glands. Normal kidneys, without hydronephrosis. Normal urinary bladder. Stomach/Bowel: Gastric antral underdistention. Scattered colonic diverticula. Normal terminal ileum. Long, tortuous appendix, terminating in the upper central pelvis. No appendicitis. Normal small bowel. No free intraperitoneal air. Vascular/Lymphatic: Aortic atherosclerosis. Prominent but not pathologically sized abdominal retroperitoneal nodes which are likely reactive. Right external iliac upper normal 1.0 cm node on 74/3. Right inguinal node is also borderline enlarged at 1.0 cm. Reproductive: Normal uterus and adnexa. Other: No significant free fluid. Musculoskeletal: Lumbosacral spondylosis. IMPRESSION: 1. Cholelithiasis with mild gallbladder distension. No specific evidence of acute cholecystitis or biliary duct dilatation. 2. Aortic Atherosclerosis (ICD10-I70.0). 3. Borderline size right pelvic and inguinal nodes, favored to be reactive. Electronically Signed   By: Abigail Miyamoto M.D.   On: 12/17/2019 17:25   US Abdomen Limited RUQ (LIVER/GB)  Result Date: 12/17/2019 CLINICAL DATA:  Abdominal pain.  Gallstones noted on recent CT scan. EXAM: ULTRASOUND ABDOMEN LIMITED RIGHT UPPER QUADRANT COMPARISON:  CT scan, same date. FINDINGS: Gallbladder: There are numerous gallstones noted in the gallbladder. The largest measures 2.5 cm. No gallbladder wall thickening, pericholecystic fluid or sonographic Murphy sign to suggest acute cholecystitis. Common bile duct: Diameter: 5.0 mm Liver: Normal hepatic  echogenicity. No focal lesions are identified. The small peripheral cyst noted on the CT scan is not identified for certain. No intrahepatic biliary dilatation. Portal vein is patent on color Doppler imaging with normal direction of blood flow towards the liver. Other: None. IMPRESSION: 1. Cholelithiasis but no CT findings for acute cholecystitis. 2. No biliary dilatation. 3. Normal liver. Electronically Signed   By: Marijo Sanes M.D.   On: 12/17/2019 19:49    Procedures Procedures (including critical care time)  Medications Ordered in ED Medications  ondansetron (ZOFRAN-ODT) disintegrating tablet 4 mg (4 mg Oral Given 12/17/19 1152)  sodium chloride 0.9 % bolus 1,000 mL (0 mLs Intravenous Stopped 12/17/19 1500)  morphine 4 MG/ML injection 4 mg (4 mg Intravenous Given 12/17/19 1357)  metoCLOPramide (REGLAN) injection 10 mg (10 mg Intravenous Given 12/17/19 1358)  promethazine (PHENERGAN) injection 12.5 mg (12.5 mg Intravenous Given 12/17/19 1643)  iohexol (OMNIPAQUE) 300 MG/ML solution 100 mL (100 mLs Intravenous Contrast Given 12/17/19 1702)  morphine 4 MG/ML injection 4 mg (4 mg Intravenous Given 12/17/19 1854)  ondansetron (ZOFRAN) injection 4 mg (4 mg Intravenous Given 12/17/19 1854)  alum & mag hydroxide-simeth (MAALOX/MYLANTA) 200-200-20 MG/5ML suspension 30 mL (30 mLs Oral Given 12/17/19 1855)    And  lidocaine (XYLOCAINE) 2 % viscous mouth solution 15 mL (15 mLs Oral Given 12/17/19 1855)    ED Course  I have reviewed the triage vital signs and the nursing notes.  Pertinent labs & imaging results that were available during my care of the patient were reviewed by me and considered in my medical decision making (see chart for details).    MDM Rules/Calculators/A&P                          Patient presents to  the ED with complaints of abdominal pain. Patient nontoxic appearing, in no apparent distress, vitals WNL. On exam patient with generalized abdominal tenderness, most notable  in the periumbilical region, no peritoneal signs. Will evaluate with labs and CT abdomen pelvis. Analgesics, anti-emetics, and fluids administered. Pt recently underwent right knee replacement, right knee with well healing incision, mild swelling but without signs of infection, do not think symptoms are related to recent surgery.  I have independently ordered, reviewed and interpreted all labs and imaging:  CBC: No leukocytosis, stable hgb CMP: No significant electrolyte derangements, normal renal and liver function Lipase: WNL UA: No hematuria or signs of infection  Imaging: CT shows cholelithiasis with mild gallbladder distension. No specific evidence of acute cholecystitis or biliary duct dilatation.  On reevaluation pt with persistent nausea and pain, will give additional meds for symptom management and get RUQ [place] to better assess for possible cholecystitis.  RUQ US shows cholelithiasis without signs on cholecystitis.    On reassessment pt with significant improvement in her symptoms, suspect symptomatic cholelithiasis. Patient tolerating PO in the emergency department. Will discharge home with supportive measures. I discussed results, treatment plan, need for general surgery follow up for gallstone and gallbladder eating plan, and return precautions with the patient. She already has pain medication at home prescribed for her knee post surgery, which is appropriate for this as well. Provided opportunity for questions, patient confirmed understanding and is in agreement with plan.     Final Clinical Impression(s) / ED Diagnoses Final diagnoses:  Abnormal CT scan, gallbladder  Symptomatic cholelithiasis    Rx / DC Orders ED Discharge Orders         Ordered    ondansetron (ZOFRAN ODT) 4 MG disintegrating tablet        12/17/19 2133    alum & mag hydroxide-simeth (MAALOX MAX) 629-528-41 MG/5ML suspension  Every 6 hours PRN        12/17/19 2133           Jacqlyn Larsen,  PA-C 12/18/19 [place], [place], DO 12/21/19 [phone]

## 2019-12-17 NOTE — ED Notes (Signed)
Pt resting in bed. Nausea improving

## 2019-12-17 NOTE — Care Management (Signed)
ED CM arranged taxi transportation via [employer] with [employer].

## 2019-12-19 ENCOUNTER — Inpatient Hospital Stay (HOSPITAL_COMMUNITY)
Admission: EM | Admit: 2019-12-19 | Discharge: 2019-12-25 | DRG: 418 | Disposition: A | Discharge status: Home Health | Payer: Medicare HMO | Attending: Internal Medicine | Admitting: Internal Medicine

## 2019-12-19 ENCOUNTER — Encounter (HOSPITAL_COMMUNITY): Payer: Self-pay

## 2019-12-19 DIAGNOSIS — E876 Hypokalemia: Secondary | ICD-10-CM | POA: Diagnosis present

## 2019-12-19 DIAGNOSIS — Z20822 Contact with and (suspected) exposure to covid-19: Secondary | ICD-10-CM | POA: Diagnosis present

## 2019-12-19 DIAGNOSIS — E669 Obesity, unspecified: Secondary | ICD-10-CM | POA: Diagnosis present

## 2019-12-19 DIAGNOSIS — K5909 Other constipation: Secondary | ICD-10-CM | POA: Diagnosis present

## 2019-12-19 DIAGNOSIS — E871 Hypo-osmolality and hyponatremia: Secondary | ICD-10-CM | POA: Diagnosis not present

## 2019-12-19 DIAGNOSIS — R1011 Right upper quadrant pain: Secondary | ICD-10-CM | POA: Diagnosis not present

## 2019-12-19 DIAGNOSIS — Z86010 Personal history of colonic polyps: Secondary | ICD-10-CM

## 2019-12-19 DIAGNOSIS — M549 Dorsalgia, unspecified: Secondary | ICD-10-CM | POA: Diagnosis present

## 2019-12-19 DIAGNOSIS — Z79899 Other long term (current) drug therapy: Secondary | ICD-10-CM

## 2019-12-19 DIAGNOSIS — Z96651 Presence of right artificial knee joint: Secondary | ICD-10-CM | POA: Diagnosis present

## 2019-12-19 DIAGNOSIS — E785 Hyperlipidemia, unspecified: Secondary | ICD-10-CM | POA: Diagnosis present

## 2019-12-19 DIAGNOSIS — K828 Other specified diseases of gallbladder: Secondary | ICD-10-CM | POA: Diagnosis present

## 2019-12-19 DIAGNOSIS — K8020 Calculus of gallbladder without cholecystitis without obstruction: Secondary | ICD-10-CM

## 2019-12-19 DIAGNOSIS — K8012 Calculus of gallbladder with acute and chronic cholecystitis without obstruction: Secondary | ICD-10-CM | POA: Diagnosis not present

## 2019-12-19 DIAGNOSIS — G8929 Other chronic pain: Secondary | ICD-10-CM | POA: Diagnosis present

## 2019-12-19 DIAGNOSIS — Z6837 Body mass index (BMI) 37.0-37.9, adult: Secondary | ICD-10-CM

## 2019-12-19 DIAGNOSIS — Z23 Encounter for immunization: Secondary | ICD-10-CM

## 2019-12-19 DIAGNOSIS — R112 Nausea with vomiting, unspecified: Secondary | ICD-10-CM | POA: Diagnosis not present

## 2019-12-19 DIAGNOSIS — Z7982 Long term (current) use of aspirin: Secondary | ICD-10-CM

## 2019-12-19 DIAGNOSIS — I10 Essential (primary) hypertension: Secondary | ICD-10-CM | POA: Diagnosis present

## 2019-12-19 LAB — COMPREHENSIVE METABOLIC PANEL
ALT: 9 U/L (ref 0–44)
AST: 19 U/L (ref 15–41)
Albumin: 4 g/dL (ref 3.5–5.0)
Alkaline Phosphatase: 54 U/L (ref 38–126)
Anion gap: 16 — ABNORMAL HIGH (ref 5–15)
BUN: 10 mg/dL (ref 8–23)
CO2: 22 mmol/L (ref 22–32)
Calcium: 9.7 mg/dL (ref 8.9–10.3)
Chloride: 97 mmol/L — ABNORMAL LOW (ref 98–111)
Creatinine, Ser: 0.88 mg/dL (ref 0.44–1.00)
GFR, Estimated: >60 mL/min (ref >60)
Glucose, Bld: 125 mg/dL — ABNORMAL HIGH (ref 70–99)
Potassium: 3.1 mmol/L — ABNORMAL LOW (ref 3.5–5.1)
Sodium: 135 mmol/L (ref 135–145)
Total Bilirubin: 0.9 mg/dL (ref 0.3–1.2)
Total Protein: 7.4 g/dL (ref 6.5–8.1)

## 2019-12-19 LAB — URINALYSIS, ROUTINE W REFLEX MICROSCOPIC
Bilirubin Urine: NEGATIVE
Glucose, UA: NEGATIVE mg/dL
Hgb urine dipstick: NEGATIVE
Ketones, ur: 80 mg/dL — AB
Leukocytes,Ua: NEGATIVE
Nitrite: NEGATIVE
Protein, ur: NEGATIVE mg/dL
Specific Gravity, Urine: 1.018 (ref 1.005–1.030)
pH: 7 (ref 5.0–8.0)

## 2019-12-19 LAB — CBC
HCT: 35.6 % — ABNORMAL LOW (ref 36.0–46.0)
Hemoglobin: 11.4 g/dL — ABNORMAL LOW (ref 12.0–15.0)
MCH: 32.4 pg (ref 26.0–34.0)
MCHC: 32 g/dL (ref 30.0–36.0)
MCV: 101.1 fL — ABNORMAL HIGH (ref 80.0–100.0)
Platelets: 390 10*3/uL (ref 150–400)
RBC: 3.52 MIL/uL — ABNORMAL LOW (ref 3.87–5.11)
RDW: 12.8 % (ref 11.5–15.5)
WBC: 7.3 10*3/uL (ref 4.0–10.5)
nRBC: 0 % (ref 0.0–0.2)

## 2019-12-19 LAB — LIPASE, BLOOD: Lipase: 34 U/L (ref 11–51)

## 2019-12-19 NOTE — ED Triage Notes (Signed)
Pt reports that she has been having abd pain since Friday and vomiting, seen here and at [facility], diagnosed with gallstones, pt has surgery scheduled for the 10th of Dec. Pt also reports constipation, pt reports she took zofran when rolling in the door, pt dryheaving.

## 2019-12-20 ENCOUNTER — Inpatient Hospital Stay (HOSPITAL_COMMUNITY): Payer: Medicare HMO | Admitting: Anesthesiology

## 2019-12-20 ENCOUNTER — Encounter (HOSPITAL_COMMUNITY)
Admission: EM | Disposition: A | Discharge status: Home Health | Payer: Self-pay | Source: Home / Self Care | Attending: Family Medicine

## 2019-12-20 ENCOUNTER — Emergency Department (HOSPITAL_COMMUNITY): Payer: Medicare HMO

## 2019-12-20 DIAGNOSIS — R1011 Right upper quadrant pain: Secondary | ICD-10-CM | POA: Diagnosis present

## 2019-12-20 HISTORY — PX: CHOLECYSTECTOMY: SHX55

## 2019-12-20 LAB — ABO/RH: ABO/RH(D): O POS

## 2019-12-20 LAB — COMPREHENSIVE METABOLIC PANEL
ALT: 9 U/L (ref 0–44)
AST: 18 U/L (ref 15–41)
Albumin: 3.7 g/dL (ref 3.5–5.0)
Alkaline Phosphatase: 51 U/L (ref 38–126)
Anion gap: 12 (ref 5–15)
BUN: 9 mg/dL (ref 8–23)
CO2: 25 mmol/L (ref 22–32)
Calcium: 9 mg/dL (ref 8.9–10.3)
Chloride: 98 mmol/L (ref 98–111)
Creatinine, Ser: 0.74 mg/dL (ref 0.44–1.00)
GFR, Estimated: >60 mL/min (ref >60)
Glucose, Bld: 138 mg/dL — ABNORMAL HIGH (ref 70–99)
Potassium: 3.1 mmol/L — ABNORMAL LOW (ref 3.5–5.1)
Sodium: 135 mmol/L (ref 135–145)
Total Bilirubin: 0.7 mg/dL (ref 0.3–1.2)
Total Protein: 6.7 g/dL (ref 6.5–8.1)

## 2019-12-20 LAB — MAGNESIUM: Magnesium: 1.7 mg/dL (ref 1.7–2.4)

## 2019-12-20 LAB — TYPE AND SCREEN
ABO/RH(D): O POS
Antibody Screen: NEGATIVE

## 2019-12-20 LAB — RESPIRATORY PANEL BY RT PCR (FLU A&B, COVID)
Influenza A by PCR: NEGATIVE
Influenza B by PCR: NEGATIVE
SARS Coronavirus 2 by RT PCR: NEGATIVE

## 2019-12-20 LAB — CBC
HCT: 31.8 % — ABNORMAL LOW (ref 36.0–46.0)
Hemoglobin: 10.3 g/dL — ABNORMAL LOW (ref 12.0–15.0)
MCH: 32.6 pg (ref 26.0–34.0)
MCHC: 32.4 g/dL (ref 30.0–36.0)
MCV: 100.6 fL — ABNORMAL HIGH (ref 80.0–100.0)
Platelets: 341 10*3/uL (ref 150–400)
RBC: 3.16 MIL/uL — ABNORMAL LOW (ref 3.87–5.11)
RDW: 12.8 % (ref 11.5–15.5)
WBC: 4.9 10*3/uL (ref 4.0–10.5)
nRBC: 0 % (ref 0.0–0.2)

## 2019-12-20 LAB — PROTIME-INR
INR: 1.1 (ref 0.8–1.2)
Prothrombin Time: 13.6 seconds (ref 11.4–15.2)

## 2019-12-20 SURGERY — LAPAROSCOPIC CHOLECYSTECTOMY
Anesthesia: General | Site: Abdomen

## 2019-12-20 MED ORDER — PROPOFOL 10 MG/ML IV BOLUS
INTRAVENOUS | Status: DC | PRN
  Administered 2019-12-20: 160 mg via INTRAVENOUS

## 2019-12-20 MED ORDER — MIDAZOLAM HCL 5 MG/5ML IJ SOLN
INTRAMUSCULAR | Status: DC | PRN
  Administered 2019-12-20: 2 mg via INTRAVENOUS

## 2019-12-20 MED ORDER — DOCUSATE SODIUM 100 MG PO CAPS
100.0000 mg | ORAL_CAPSULE | Freq: Every day | ORAL | Status: DC | PRN
  Administered 2019-12-22: 100 mg via ORAL
  Filled 2019-12-20: qty 1

## 2019-12-20 MED ORDER — METOPROLOL TARTRATE 5 MG/5ML IV SOLN
2.5000 mg | Freq: Four times a day (QID) | INTRAVENOUS | Status: DC | PRN
  Administered 2019-12-20 – 2019-12-22 (×4): 2.5 mg via INTRAVENOUS
  Filled 2019-12-20 (×4): qty 5

## 2019-12-20 MED ORDER — POTASSIUM CHLORIDE CRYS ER 10 MEQ PO TBCR
10.0000 meq | EXTENDED_RELEASE_TABLET | Freq: Every day | ORAL | Status: DC
  Administered 2019-12-20 – 2019-12-24 (×5): 10 meq via ORAL
  Filled 2019-12-20 (×5): qty 1

## 2019-12-20 MED ORDER — HYDROMORPHONE HCL 1 MG/ML IJ SOLN
1.0000 mg | INTRAMUSCULAR | Status: DC | PRN
  Administered 2019-12-20 – 2019-12-22 (×9): 1 mg via INTRAVENOUS
  Filled 2019-12-20 (×10): qty 1

## 2019-12-20 MED ORDER — MAGNESIUM SULFATE 2 GM/50ML IV SOLN
2.0000 g | Freq: Once | INTRAVENOUS | Status: DC
  Filled 2019-12-20: qty 50

## 2019-12-20 MED ORDER — ONDANSETRON HCL 4 MG PO TABS: 4.0000 mg | ORAL_TABLET | Freq: Three times a day (TID) | ORAL | Status: DC | PRN

## 2019-12-20 MED ORDER — KETOROLAC TROMETHAMINE 15 MG/ML IJ SOLN
15.0000 mg | INTRAMUSCULAR | Status: AC
  Administered 2019-12-20: 15 mg via INTRAVENOUS
  Filled 2019-12-20: qty 1

## 2019-12-20 MED ORDER — BUPIVACAINE HCL (PF) 0.25 % IJ SOLN
INTRAMUSCULAR | Status: AC
  Filled 2019-12-20: qty 30

## 2019-12-20 MED ORDER — SUCCINYLCHOLINE CHLORIDE 200 MG/10ML IV SOSY
PREFILLED_SYRINGE | INTRAVENOUS | Status: DC | PRN
  Administered 2019-12-20: 120 mg via INTRAVENOUS

## 2019-12-20 MED ORDER — BUPIVACAINE HCL 0.25 % IJ SOLN
INTRAMUSCULAR | Status: DC | PRN
  Administered 2019-12-20: 26 mL

## 2019-12-20 MED ORDER — ENOXAPARIN SODIUM 40 MG/0.4ML ~~LOC~~ SOLN
40.0000 mg | SUBCUTANEOUS | Status: DC
  Administered 2019-12-21 – 2019-12-25 (×5): 40 mg via SUBCUTANEOUS
  Filled 2019-12-20 (×5): qty 0.4

## 2019-12-20 MED ORDER — PANTOPRAZOLE SODIUM 40 MG IV SOLR
40.0000 mg | Freq: Once | INTRAVENOUS | Status: AC
  Administered 2019-12-20: 40 mg via INTRAVENOUS
  Filled 2019-12-20: qty 40

## 2019-12-20 MED ORDER — LIDOCAINE HCL (PF) 2 % IJ SOLN
INTRAMUSCULAR | Status: AC
  Filled 2019-12-20: qty 15

## 2019-12-20 MED ORDER — ROCURONIUM BROMIDE 10 MG/ML (PF) SYRINGE
PREFILLED_SYRINGE | INTRAVENOUS | Status: AC
  Filled 2019-12-20: qty 30

## 2019-12-20 MED ORDER — PHENYLEPHRINE 40 MCG/ML (10ML) SYRINGE FOR IV PUSH (FOR BLOOD PRESSURE SUPPORT)
PREFILLED_SYRINGE | INTRAVENOUS | Status: DC | PRN
  Administered 2019-12-20: 40 ug via INTRAVENOUS

## 2019-12-20 MED ORDER — HYDROCHLOROTHIAZIDE 25 MG PO TABS
25.0000 mg | ORAL_TABLET | Freq: Every day | ORAL | Status: DC
  Administered 2019-12-21 – 2019-12-25 (×5): 25 mg via ORAL
  Filled 2019-12-20 (×5): qty 1

## 2019-12-20 MED ORDER — KETOROLAC TROMETHAMINE 30 MG/ML IJ SOLN
INTRAMUSCULAR | Status: AC
  Filled 2019-12-20: qty 1

## 2019-12-20 MED ORDER — SUGAMMADEX SODIUM 200 MG/2ML IV SOLN
INTRAVENOUS | Status: DC | PRN
  Administered 2019-12-20 (×2): 100 mg via INTRAVENOUS

## 2019-12-20 MED ORDER — ONDANSETRON HCL 4 MG/2ML IJ SOLN
INTRAMUSCULAR | Status: DC | PRN
  Administered 2019-12-20: 4 mg via INTRAVENOUS

## 2019-12-20 MED ORDER — DEXMEDETOMIDINE (PRECEDEX) IN NS 20 MCG/5ML (4 MCG/ML) IV SYRINGE
PREFILLED_SYRINGE | INTRAVENOUS | Status: DC | PRN
  Administered 2019-12-20: 4 ug via INTRAVENOUS
  Administered 2019-12-20 (×2): 8 ug via INTRAVENOUS

## 2019-12-20 MED ORDER — ASCORBIC ACID 500 MG PO TABS
500.0000 mg | ORAL_TABLET | Freq: Every day | ORAL | Status: DC
  Administered 2019-12-21 – 2019-12-25 (×5): 500 mg via ORAL
  Filled 2019-12-20 (×6): qty 1

## 2019-12-20 MED ORDER — LORATADINE 10 MG PO TABS
10.0000 mg | ORAL_TABLET | Freq: Every day | ORAL | Status: DC
  Administered 2019-12-21 – 2019-12-25 (×5): 10 mg via ORAL
  Filled 2019-12-20 (×5): qty 1

## 2019-12-20 MED ORDER — SCOPOLAMINE 1 MG/3DAYS TD PT72
1.0000 | MEDICATED_PATCH | TRANSDERMAL | Status: DC
  Administered 2019-12-20: 1.5 mg via TRANSDERMAL
  Filled 2019-12-20: qty 1

## 2019-12-20 MED ORDER — ONDANSETRON HCL 4 MG/2ML IJ SOLN
4.0000 mg | Freq: Four times a day (QID) | INTRAMUSCULAR | Status: DC | PRN
  Administered 2019-12-20 – 2019-12-24 (×12): 4 mg via INTRAVENOUS
  Filled 2019-12-20 (×12): qty 2

## 2019-12-20 MED ORDER — GABAPENTIN 300 MG PO CAPS: 300.0000 mg | ORAL_CAPSULE | ORAL | Status: DC

## 2019-12-20 MED ORDER — ONDANSETRON HCL 4 MG/2ML IJ SOLN
4.0000 mg | Freq: Once | INTRAMUSCULAR | Status: AC
  Administered 2019-12-20: 4 mg via INTRAVENOUS
  Filled 2019-12-20: qty 2

## 2019-12-20 MED ORDER — MIDAZOLAM HCL 2 MG/2ML IJ SOLN
INTRAMUSCULAR | Status: AC
  Filled 2019-12-20: qty 2

## 2019-12-20 MED ORDER — AMISULPRIDE (ANTIEMETIC) 5 MG/2ML IV SOLN
10.0000 mg | Freq: Once | INTRAVENOUS | Status: AC
  Filled 2019-12-20: qty 4

## 2019-12-20 MED ORDER — ACETAMINOPHEN 500 MG PO TABS: 1000.0000 mg | ORAL_TABLET | ORAL | Status: DC

## 2019-12-20 MED ORDER — KETOROLAC TROMETHAMINE 15 MG/ML IJ SOLN
INTRAMUSCULAR | Status: DC | PRN
  Administered 2019-12-20: 15 mg via INTRAVENOUS

## 2019-12-20 MED ORDER — PHENYLEPHRINE 40 MCG/ML (10ML) SYRINGE FOR IV PUSH (FOR BLOOD PRESSURE SUPPORT)
PREFILLED_SYRINGE | INTRAVENOUS | Status: AC
  Filled 2019-12-20: qty 10

## 2019-12-20 MED ORDER — VITAMIN D 25 MCG (1000 UNIT) PO TABS
1000.0000 [IU] | ORAL_TABLET | Freq: Every day | ORAL | Status: DC
  Administered 2019-12-21 – 2019-12-25 (×5): 1000 [IU] via ORAL
  Filled 2019-12-20 (×5): qty 1

## 2019-12-20 MED ORDER — FENTANYL CITRATE (PF) 250 MCG/5ML IJ SOLN
INTRAMUSCULAR | Status: AC
  Filled 2019-12-20: qty 5

## 2019-12-20 MED ORDER — SODIUM CHLORIDE 0.9 % IV SOLN: 500.0000 mL | INTRAVENOUS | Status: DC

## 2019-12-20 MED ORDER — LACTATED RINGERS IV SOLN: INTRAVENOUS | Status: DC

## 2019-12-20 MED ORDER — DEXAMETHASONE SODIUM PHOSPHATE 10 MG/ML IJ SOLN
INTRAMUSCULAR | Status: DC | PRN
  Administered 2019-12-20: 10 mg via INTRAVENOUS

## 2019-12-20 MED ORDER — POTASSIUM CHLORIDE IN NACL 40-0.9 MEQ/L-% IV SOLN
INTRAVENOUS | Status: DC
  Filled 2019-12-20 (×2): qty 1000

## 2019-12-20 MED ORDER — ALUM & MAG HYDROXIDE-SIMETH 200-200-20 MG/5ML PO SUSP
15.0000 mL | Freq: Four times a day (QID) | ORAL | Status: DC | PRN
  Administered 2019-12-22: 15 mL via ORAL
  Filled 2019-12-20: qty 30

## 2019-12-20 MED ORDER — SODIUM CHLORIDE 0.9 % IV SOLN: INTRAVENOUS | Status: DC

## 2019-12-20 MED ORDER — ONDANSETRON HCL 4 MG/2ML IJ SOLN
INTRAMUSCULAR | Status: AC
  Filled 2019-12-20: qty 8

## 2019-12-20 MED ORDER — SODIUM CHLORIDE 0.9 % IV SOLN
2.0000 g | INTRAVENOUS | Status: DC
  Administered 2019-12-20 – 2019-12-21 (×3): 2 g via INTRAVENOUS
  Filled 2019-12-20 (×2): qty 20
  Filled 2019-12-20: qty 2

## 2019-12-20 MED ORDER — METOPROLOL TARTRATE 12.5 MG HALF TABLET
12.5000 mg | ORAL_TABLET | Freq: Every day | ORAL | Status: DC
  Administered 2019-12-20 – 2019-12-25 (×6): 12.5 mg via ORAL
  Filled 2019-12-20 (×6): qty 1

## 2019-12-20 MED ORDER — DEXAMETHASONE SODIUM PHOSPHATE 10 MG/ML IJ SOLN
INTRAMUSCULAR | Status: AC
  Filled 2019-12-20: qty 2

## 2019-12-20 MED ORDER — SIMETHICONE 80 MG PO CHEW: 80.0000 mg | CHEWABLE_TABLET | Freq: Four times a day (QID) | ORAL | Status: DC | PRN

## 2019-12-20 MED ORDER — SIMVASTATIN 20 MG PO TABS
20.0000 mg | ORAL_TABLET | Freq: Every day | ORAL | Status: DC
  Administered 2019-12-21 – 2019-12-25 (×5): 20 mg via ORAL
  Filled 2019-12-20 (×5): qty 1

## 2019-12-20 MED ORDER — 0.9 % SODIUM CHLORIDE (POUR BTL) OPTIME
TOPICAL | Status: DC | PRN
  Administered 2019-12-20: 1000 mL

## 2019-12-20 MED ORDER — ADULT MULTIVITAMIN W/MINERALS CH
1.0000 | ORAL_TABLET | Freq: Every day | ORAL | Status: DC
  Administered 2019-12-21 – 2019-12-25 (×5): 1 via ORAL
  Filled 2019-12-20 (×6): qty 1

## 2019-12-20 MED ORDER — PROMETHAZINE HCL 25 MG/ML IJ SOLN
6.2500 mg | Freq: Four times a day (QID) | INTRAMUSCULAR | Status: DC | PRN
  Administered 2019-12-20 – 2019-12-24 (×10): 6.25 mg via INTRAVENOUS
  Filled 2019-12-20 (×10): qty 1

## 2019-12-20 MED ORDER — SUCCINYLCHOLINE CHLORIDE 200 MG/10ML IV SOSY
PREFILLED_SYRINGE | INTRAVENOUS | Status: AC
  Filled 2019-12-20: qty 20

## 2019-12-20 MED ORDER — ROCURONIUM BROMIDE 10 MG/ML (PF) SYRINGE
PREFILLED_SYRINGE | INTRAVENOUS | Status: DC | PRN
  Administered 2019-12-20: 50 mg via INTRAVENOUS

## 2019-12-20 MED ORDER — AMISULPRIDE (ANTIEMETIC) 5 MG/2ML IV SOLN
INTRAVENOUS | Status: AC
  Administered 2019-12-20: 10 mg via INTRAVENOUS
  Filled 2019-12-20: qty 4

## 2019-12-20 MED ORDER — SENNOSIDES-DOCUSATE SODIUM 8.6-50 MG PO TABS
2.0000 | ORAL_TABLET | Freq: Two times a day (BID) | ORAL | Status: DC
  Administered 2019-12-21 – 2019-12-25 (×9): 2 via ORAL
  Filled 2019-12-20 (×10): qty 2

## 2019-12-20 MED ORDER — HYDROMORPHONE HCL 1 MG/ML IJ SOLN
1.0000 mg | Freq: Once | INTRAMUSCULAR | Status: AC
  Administered 2019-12-20: 1 mg via INTRAVENOUS
  Filled 2019-12-20: qty 1

## 2019-12-20 MED ORDER — SODIUM CHLORIDE 0.9 % IV BOLUS (SEPSIS)
1000.0000 mL | Freq: Once | INTRAVENOUS | Status: AC
  Administered 2019-12-20: 1000 mL via INTRAVENOUS

## 2019-12-20 MED ORDER — SODIUM CHLORIDE 0.9 % IR SOLN
Status: DC | PRN
  Administered 2019-12-20: 1000 mL

## 2019-12-20 MED ORDER — FENTANYL CITRATE (PF) 250 MCG/5ML IJ SOLN
INTRAMUSCULAR | Status: DC | PRN
  Administered 2019-12-20: 150 ug via INTRAVENOUS
  Administered 2019-12-20 (×2): 50 ug via INTRAVENOUS

## 2019-12-20 SURGICAL SUPPLY — 46 items
APPLIER CLIP 5 13 M/L LIGAMAX5 (MISCELLANEOUS)
CANISTER SUCT 3000ML PPV (MISCELLANEOUS) ×4 IMPLANT
CHLORAPREP W/TINT 26 (MISCELLANEOUS) ×4 IMPLANT
CLIP APPLIE 5 13 M/L LIGAMAX5 (MISCELLANEOUS) IMPLANT
CLIP VESOLOCK MED LG 6/CT (CLIP) ×4 IMPLANT
COVER MAYO STAND STRL (DRAPES) IMPLANT
COVER SURGICAL LIGHT HANDLE (MISCELLANEOUS) ×4 IMPLANT
COVER TRANSDUCER ULTRASND (DRAPES) ×4 IMPLANT
COVER WAND RF STERILE (DRAPES) ×4 IMPLANT
DERMABOND ADVANCED (GAUZE/BANDAGES/DRESSINGS) ×2
DERMABOND ADVANCED .7 DNX12 (GAUZE/BANDAGES/DRESSINGS) ×2 IMPLANT
DRAPE C-ARM 42X120 X-RAY (DRAPES) IMPLANT
ELECT REM PT RETURN 9FT ADLT (ELECTROSURGICAL) ×4
ELECTRODE REM PT RTRN 9FT ADLT (ELECTROSURGICAL) ×2 IMPLANT
GLOVE BIO SURGEON STRL SZ7.5 (GLOVE) ×4 IMPLANT
GOWN STRL REUS W/ TWL LRG LVL3 (GOWN DISPOSABLE) ×4 IMPLANT
GOWN STRL REUS W/ TWL XL LVL3 (GOWN DISPOSABLE) ×2 IMPLANT
GOWN STRL REUS W/TWL LRG LVL3 (GOWN DISPOSABLE) ×4
GOWN STRL REUS W/TWL XL LVL3 (GOWN DISPOSABLE) ×2
GRASPER SUT TROCAR 14GX15 (MISCELLANEOUS) ×4 IMPLANT
IV CATH 14GX2 1/4 (CATHETERS) IMPLANT
KIT BASIN OR (CUSTOM PROCEDURE TRAY) ×4 IMPLANT
KIT TURNOVER KIT B (KITS) ×4 IMPLANT
NEEDLE INSUFFLATION 14GA 120MM (NEEDLE) ×4 IMPLANT
NS IRRIG 1000ML POUR BTL (IV SOLUTION) ×4 IMPLANT
PAD ARMBOARD 7.5X6 YLW CONV (MISCELLANEOUS) ×8 IMPLANT
POUCH LAPAROSCOPIC INSTRUMENT (MISCELLANEOUS) ×4 IMPLANT
POUCH RETRIEVAL ECOSAC 10 (ENDOMECHANICALS) ×2 IMPLANT
POUCH RETRIEVAL ECOSAC 10MM (ENDOMECHANICALS) ×2
SCISSORS LAP 5X35 DISP (ENDOMECHANICALS) ×4 IMPLANT
SET CHOLANGIOGRAPHY FRANKLIN (SET/KITS/TRAYS/PACK) IMPLANT
SET IRRIG TUBING LAPAROSCOPIC (IRRIGATION / IRRIGATOR) ×4 IMPLANT
SET TUBE SMOKE EVAC HIGH FLOW (TUBING) ×4 IMPLANT
SLEEVE ENDOPATH XCEL 5M (ENDOMECHANICALS) ×4 IMPLANT
SPECIMEN JAR SMALL (MISCELLANEOUS) ×4 IMPLANT
STOPCOCK 4 WAY LG BORE MALE ST (IV SETS) IMPLANT
SUT MNCRL AB 4-0 PS2 18 (SUTURE) ×4 IMPLANT
SUT VICRYL 0 AB UR-6 (SUTURE) ×4 IMPLANT
SUT VICRYL 0 UR6 27IN ABS (SUTURE) ×8 IMPLANT
TOWEL GREEN STERILE (TOWEL DISPOSABLE) ×4 IMPLANT
TOWEL GREEN STERILE FF (TOWEL DISPOSABLE) ×4 IMPLANT
TRAY LAPAROSCOPIC MC (CUSTOM PROCEDURE TRAY) ×4 IMPLANT
TROCAR XCEL NON-BLD 11X100MML (ENDOMECHANICALS) ×4 IMPLANT
TROCAR XCEL NON-BLD 5MMX100MML (ENDOMECHANICALS) ×4 IMPLANT
WARMER LAPAROSCOPE (MISCELLANEOUS) ×4 IMPLANT
WATER STERILE IRR 1000ML POUR (IV SOLUTION) ×4 IMPLANT

## 2019-12-20 NOTE — Anesthesia Procedure Notes (Signed)
Procedure Name: Intubation Date/Time: 12/20/2019 11:04 AM Performed by: Trinna Post., CRNA Pre-anesthesia Checklist: Patient identified, Emergency Drugs available, Suction available, Patient being monitored and Timeout performed Patient Re-evaluated:Patient Re-evaluated prior to induction Oxygen Delivery Method: Circle system utilized Preoxygenation: Pre-oxygenation with 100% oxygen Induction Type: IV induction, Rapid sequence and Cricoid Pressure applied Laryngoscope Size: Mac and 3 Grade View: Grade II Tube type: Oral Tube size: 7.0 mm Number of attempts: 2 Airway Equipment and Method: Stylet Placement Confirmation: ETT inserted through vocal cords under direct vision,  positive ETCO2 and breath sounds checked- equal and bilateral Secured at: 22 cm Tube secured with: Tape Dental Injury: Teeth and Oropharynx as per pre-operative assessment

## 2019-12-20 NOTE — H&P (Signed)
History and Physical  Raven Mclean [id number] DOB: Mar 11, 1955 DOA: 12/19/2019  Referring physician: Dr. Leonides Schanz PCP: [facility], [facility] (Inactive)  Outpatient Specialists: Neurosurgery. Patient coming from: Home.  Chief Complaint: Right upper quadrant pain, nausea and vomiting.  HPI: Raven Mclean is a 64 y.o. female with medical history significant for chronic back pain, status post total right knee replacement 2 weeks ago at [facility], essential hypertension, hyperlipidemia who presented to [facility] ED with complaints of worsening severe right upper quadrant abdominal pain.  Associated with intermittent nausea and vomiting since her surgery 2 weeks ago.  Symptoms are worse after eating.  She recently had a work-up done at [facility] for the same where she was found to have cholelithiasis.  She has an appointment to see her general surgeon on December 10 but does could not wait that long.  EDP consulted general surgery Dr. Erlinda Hong,  plan for lap chole on 12/20/2019.  [facility], hospitalist team, asked to admit.  ED Course: T-max 99.4,Heart rate 102, respiration rate 23.  Lab studies remarkable for potassium 3.1.  LFTs and lipase levels normal.  No leukocytosis.  Hemoglobin 11.4K from 11K, 2 days ago, MCV 101.  Review of Systems: Review of systems as noted in the HPI. All other systems reviewed and are negative.   Past Medical History:  Diagnosis Date  . Arthritis   . Blood transfusion   . Chronic back pain   . Hyperlipidemia   . Hypertension   . Neuromuscular disorder (HCC)    spasms in legs   Past Surgical History:  Procedure Laterality Date  . BACK SURGERY    . CESAREAN SECTION    . COLONOSCOPY    . POLYPECTOMY    . TRIGGER FINGER RELEASE      Social History:  reports that she has never smoked. She has never used smokeless tobacco. She reports that she does not drink alcohol and does not use drugs.   Allergies  Allergen Reactions  . Hydrocodone-Acetaminophen Itching    Family History    Problem Relation Age of Onset  . Heart disease Mother   . Heart disease Brother   . Colon cancer Neg Hx       Prior to Admission medications   Medication Sig Start Date End Date Taking? Authorizing Provider  acetaminophen (TYLENOL) 500 MG tablet Take 1,000 mg by mouth in the morning, at noon, and at bedtime.    [provider]  alum & mag hydroxide-simeth (MAALOX MAX) 400-400-40 MG/5ML suspension Take 15 mLs by mouth every 6 (six) hours as needed for indigestion. 12/17/19   Jacqlyn Larsen, PA-C  Ascorbic Acid (VITAMIN C WITH ROSE HIPS) 500 MG tablet Take 500 mg by mouth daily.    [provider]  aspirin 81 MG chewable tablet Chew 81 mg by mouth in the morning and at bedtime.    [provider]  cholecalciferol (VITAMIN D3) 25 MCG (1000 UNIT) tablet Take 1,000 Units by mouth daily.    [provider]  diclofenac Sodium (VOLTAREN) 1 % GEL Apply 2 g topically 4 (four) times daily.    [provider]  docusate sodium (COLACE) 100 MG capsule Take 100 mg by mouth daily as needed for mild constipation.    [provider]  hydrochlorothiazide (HYDRODIURIL) 25 MG tablet Take 25 mg by mouth daily.    [provider]  lidocaine (LIDODERM) 5 % Place 1-3 patches onto the skin daily. Remove & Discard patch within 12 hours or  as directed by MD    [provider]  loratadine (CLARITIN) 10 MG tablet Take 1 tablet (10 mg total) by mouth daily. 03/29/15   Ashley Murrain, NP  metoprolol tartrate (LOPRESSOR) 50 MG tablet Take 50 mg by mouth daily.    [provider]  Multiple Vitamins-Minerals (MULTIVITAMIN WITH MINERALS) tablet Take 1 tablet by mouth daily.    [provider]  ondansetron (ZOFRAN ODT) 4 MG disintegrating tablet 4mg  ODT q4 hours prn nausea/vomit 12/17/19   Jacqlyn Larsen, PA-C  ondansetron (ZOFRAN) 4 MG tablet Take 4 mg by mouth every 8 (eight) hours as needed for nausea or vomiting.    [provider]  oxycodone (OXY-IR) 5 MG capsule Take 5-10 mg by mouth every 4 (four) hours as needed for pain.    [provider]  Phendimetrazine Tartrate 35 MG TABS Take 35 mg by mouth in the morning, at noon, and at bedtime. Patient not taking: Reported on 12/17/2019    [provider]  potassium chloride (KLOR-CON) 10 MEQ tablet Take 10 mEq by mouth daily.    [provider]  simethicone (MYLICON) 80 MG chewable tablet Chew 80 mg by mouth every 6 (six) hours as needed for flatulence.    [provider]  simvastatin (ZOCOR) 20 MG tablet Take 20 mg by mouth daily.    [provider]    Physical Exam: BP (!) 149/68   Pulse 83   Temp (!) 97.4 F (36.3 C) (Oral)   Resp (!) 23   SpO2 100%   . General: 64 y.o. year-old female well developed well nourished in no acute distress.  Alert and oriented x3.  Uncomfortable due to pain, nausea with dry heaving. . Cardiovascular: Regular rate and rhythm with no rubs or gallops.  No thyromegaly or JVD noted.  No lower extremity edema. 2/4 pulses in all 4 extremities. Marland Kitchen Respiratory: Clear to auscultation with no wheezes or rales. Good inspiratory effort. . Abdomen: Soft R upper quadrant tenderness with hypoactive bowel sounds x4 quadrants. . Muskuloskeletal: No cyanosis, clubbing or edema noted bilaterally . Neuro: CN II-XII intact, strength, sensation, reflexes . Skin: No ulcerative lesions noted or rashes . Psychiatry: Judgement and insight appear normal. Mood is appropriate for condition and setting          Labs on Admission:  Basic Metabolic Panel: Recent Labs  Lab 12/17/19 1149 12/19/19 2019  NA 140 135  K 3.6 3.1*  CL 101 97*  CO2 27 22  GLUCOSE 103* 125*  BUN 10 10  CREATININE 0.80 0.88  CALCIUM 10.1 9.7   Liver Function Tests: Recent Labs  Lab 12/17/19 1149 12/19/19 2019  AST 17 19  ALT 9 9  ALKPHOS 60 54  BILITOT 0.6 0.9  PROT 7.2 7.4  ALBUMIN 3.9 4.0   Recent Labs  Lab  12/17/19 1149 12/19/19 2019  LIPASE 37 34   No results for input(s): AMMONIA in the last 168 hours. CBC: Recent Labs  Lab 12/17/19 1149 12/19/19 2019  WBC 4.3 7.3  HGB 11.0* 11.4*  HCT 34.9* 35.6*  MCV 102.3* 101.1*  PLT 387 390   Cardiac Enzymes: No results for input(s): CKTOTAL, CKMB, CKMBINDEX, TROPONINI in the last 168 hours.  BNP (last 3 results) No results for input(s): BNP in the last 8760 hours.  ProBNP (last 3 results) No results for input(s): PROBNP in the last 8760 hours.  CBG: No results for input(s): GLUCAP in the last 168 hours.  Radiological Exams on Admission: US Abdomen Limited RUQ (LIVER/GB)  Result Date: 12/20/2019 CLINICAL DATA:  Right upper quadrant pain, nausea, vomiting EXAM: ULTRASOUND ABDOMEN LIMITED RIGHT UPPER QUADRANT COMPARISON:  12/17/2019 FINDINGS: Gallbladder: Multiple mobile gallstones are again seen within the gallbladder measuring up to 2.2 cm in greatest dimension. The gallbladder, however, is not distended, there is no gallbladder wall thickening, and no pericholecystic fluid is identified. The sonographic Percell Miller sign is reportedly negative. Common bile duct: Diameter: 2 mm in proximal diameter Liver: No focal lesion identified. Within normal limits in parenchymal echogenicity. Portal vein is patent on color Doppler imaging with normal direction of blood flow towards the liver. Other: None. IMPRESSION: Cholelithiasis without sonographic evidence of acute cholecystitis Electronically Signed   By: Fidela Salisbury MD   On: 12/20/2019 03:23    EKG: I independently viewed the EKG done and my findings are as followed: None available at the time of this visit.  Assessment/Plan Present on Admission: . RUQ abdominal pain  Active Problems:   RUQ abdominal pain  Right upper quadrant abdominal pain in the setting of cholelithiasis She had a CT abdomen and pelvis with contrast done on 12/17/2019 which showed cholelithiasis with mild gallbladder  distention.  No specific evidence of acute cholecystitis or biliary duct dilatation. Right upper quadrant abdominal ultrasound done on 12/20/2019 showed cholelithiasis without sonographic evidence of acute cholecystitis. General surgery Dr. Erlinda Hong was consulted by [facility] for lap chole on 12/20/2019 N.p.o. IV fluid, IV antiemetics PRN Pain control PRN, add scheduled bowel regimen.  Refractory nausea and vomiting likely secondary to above IV antiemetics as needed IV fluid  Hypokalemia Serum potassium 3.1, goal 4.0 Start IV normal saline KCl 40 mEq 100 cc/h Ordered IV magnesium 2 g x 1. Repeat BMP  Essential hypertension BP stable Resume home oral antihypertensives, Lopressor 12.5 mg daily Hold home HCTZ IV Lopressor PRN with parameters.  Hyperlipidemia Resume home simvastatin  Chronic constipation, likely opiate induced Bowel regimen   DVT prophylaxis: SCDs; defer pharmacological DVT prophylaxis to general surgery.  Code Status: Full code per the patient herself.  Family Communication: Her mother at bedside.  Disposition Plan: Admit to MedSurg unit.  Consults called: General surgery consulted by EDP.  Admission status: Inpatient status.  Patient will require at least 2 midnights for further evaluation and treatment of present condition.   Status is: Inpatient    Dispo:  Patient From: Home  Planned Disposition: Home  Expected discharge date: 12/23/19  Medically stable for discharge: No, ongoing management of right upper quadrant abdominal pain, intractable nausea and vomiting.        Kayleen Memos MD [facility] Pager [phone]  If 7PM-7AM, please contact night-coverage [email or web address] Password [email or web address]  12/20/2019, 4:14 AM

## 2019-12-20 NOTE — Discharge Instructions (Signed)
[facility] LAPAROSCOPIC SURGERY: POST OP INSTRUCTIONS Always review your discharge instruction sheet given to you by the facility where your surgery was performed. IF YOU HAVE DISABILITY OR FAMILY LEAVE FORMS, YOU MUST BRING THEM TO THE OFFICE FOR PROCESSING.   DO NOT GIVE THEM TO YOUR DOCTOR.  PAIN CONTROL  1. First take acetaminophen (Tylenol) AND/or ibuprofen (Advil) to control your pain after surgery.  Follow directions on package.  Taking acetaminophen (Tylenol) and/or ibuprofen (Advil) regularly after surgery will help to control your pain and lower the amount of prescription pain medication you may need.  You should not take more than 3,000 mg (3 grams) of acetaminophen (Tylenol) in 24 hours.  You should not take ibuprofen (Advil), aleve, motrin, naprosyn or other NSAIDS if you have a history of stomach ulcers or chronic kidney disease.  2. A prescription for pain medication may be given to you upon discharge.  Take your pain medication as prescribed, if you still have uncontrolled pain after taking acetaminophen (Tylenol) or ibuprofen (Advil). 3. Use ice packs to help control pain. 4. If you need a refill on your pain medication, please contact your pharmacy.  They will contact our office to request authorization. Prescriptions will not be filled after 5pm or on week-ends.  HOME MEDICATIONS 5. Take your usually prescribed medications unless otherwise directed.  DIET 6. You should follow a light diet the first few days after arrival home.  Be sure to include lots of fluids daily. Avoid fatty, fried foods.   CONSTIPATION 7. It is common to experience some constipation after surgery and if you are taking pain medication.  Increasing fluid intake and taking a stool softener (such as Colace) will usually help or prevent this problem from occurring.  A mild laxative (Milk of Magnesia or Miralax) should be taken according to package instructions if there are no bowel  movements after 48 hours.  WOUND/INCISION CARE 8. Most patients will experience some swelling and bruising in the area of the incisions.  Ice packs will help.  Swelling and bruising can take several days to resolve.  9. Unless discharge instructions indicate otherwise, follow guidelines below  a. STERI-STRIPS - you may remove your outer bandages 48 hours after surgery, and you may shower at that time.  You have steri-strips (small skin tapes) in place directly over the incision.  These strips should be left on the skin for 7-10 days.   b. DERMABOND/SKIN GLUE - you may shower in 24 hours.  The glue will flake off over the next 2-3 weeks. 10. Any sutures or staples will be removed at the office during your follow-up visit.  ACTIVITIES 11. You may resume regular (light) daily activities beginning the next day--such as daily self-care, walking, climbing stairs--gradually increasing activities as tolerated.  You may have sexual intercourse when it is comfortable.  Refrain from any heavy lifting or straining until approved by your doctor. a. You may drive when you are no longer taking prescription pain medication, you can comfortably wear a seatbelt, and you can safely maneuver your car and apply brakes.  FOLLOW-UP 12. You should see your doctor in the office for a follow-up appointment approximately 2-3 weeks after your surgery.  You should have been given your post-op/follow-up appointment when your surgery was scheduled.  If you did not receive a post-op/follow-up appointment, make sure that you call for this appointment within a day or two after you arrive home to insure a convenient appointment time.     WHEN TO CALL YOUR DOCTOR: 1. Fever over 101.0 2. Inability to urinate 3. Continued bleeding from incision. 4. Increased pain, redness, or drainage from the incision. 5. Increasing abdominal pain  The clinic staff is available to answer your questions during regular business hours.  Please don't  hesitate to call and ask to speak to one of the nurses for clinical concerns.  If you have a medical emergency, go to the nearest emergency room or call 911.  A surgeon from [facility] is always on call at the hospital. [address], [address], [place], [place]  [address] ? [address] [address], [place], [place]   [address] [phone] ? [phone] ? FAX [phone] Web site: [email or web address]  .........   Managing Your Pain After Surgery Without Opioids    Thank you for participating in our program to help patients manage their pain after surgery without opioids. This is part of our effort to provide you with the best care possible, without exposing you or your family to the risk that opioids pose.  What pain can I expect after surgery? You can expect to have some pain after surgery. This is normal. The pain is typically worse the day after surgery, and quickly begins to get better. Many studies have found that many patients are able to manage their pain after surgery with Over-the-Counter (OTC) medications such as Tylenol and Motrin. If you have a condition that does not allow you to take Tylenol or Motrin, notify your surgical team.  How will I manage my pain? The best strategy for controlling your pain after surgery is around the clock pain control with Tylenol (acetaminophen) and Motrin (ibuprofen or Advil). Alternating these medications with each other allows you to maximize your pain control. In addition to Tylenol and Motrin, you can use heating pads or ice packs on your incisions to help reduce your pain.  How will I alternate your regular strength over-the-counter pain medication? You will take a dose of pain medication every three hours. ; Start by taking 650 mg of Tylenol (2 pills of 325 mg) ; 3 hours later take 600 mg of Motrin (3 pills of 200 mg) ; 3 hours after taking the Motrin take 650 mg of Tylenol ; 3 hours after that take 600 mg of  Motrin.   - 1 -  See example - if your first dose of Tylenol is at 12:00 PM   12:00 PM Tylenol 650 mg (2 pills of 325 mg)  3:00 PM Motrin 600 mg (3 pills of 200 mg)  6:00 PM Tylenol 650 mg (2 pills of 325 mg)  9:00 PM Motrin 600 mg (3 pills of 200 mg)  Continue alternating every 3 hours   We recommend that you follow this schedule around-the-clock for at least 3 days after surgery, or until you feel that it is no longer needed. Use the table on the last page of this handout to keep track of the medications you are taking. Important: Do not take more than 3000mg of Tylenol or 3200mg of Motrin in a 24-hour period. Do not take ibuprofen/Motrin if you have a history of bleeding stomach ulcers, severe kidney disease, &/or actively taking a blood thinner  What if I still have pain? If you have pain that is not controlled with the over-the-counter pain medications (Tylenol and Motrin or Advil) you might have what we call "breakthrough" pain. You will receive a prescription for a small amount of an opioid pain medication such as   Oxycodone, Tramadol, or Tylenol with Codeine. Use these opioid pills in the first 24 hours after surgery if you have breakthrough pain. Do not take more than 1 pill every 4-6 hours.  If you still have uncontrolled pain after using all opioid pills, don't hesitate to call our staff using the number provided. We will help make sure you are managing your pain in the best way possible, and if necessary, we can provide a prescription for additional pain medication.   Day 1    Time  Name of Medication Number of pills taken  Amount of Acetaminophen  Pain Level   Comments  AM PM       AM PM       AM PM       AM PM       AM PM       AM PM       AM PM       AM PM       Total Daily amount of Acetaminophen Do not take more than  3,000 mg per day      Day 2    Time  Name of Medication Number of pills taken  Amount of Acetaminophen  Pain Level   Comments  AM  PM       AM PM       AM PM       AM PM       AM PM       AM PM       AM PM       AM PM       Total Daily amount of Acetaminophen Do not take more than  3,000 mg per day      Day 3    Time  Name of Medication Number of pills taken  Amount of Acetaminophen  Pain Level   Comments  AM PM       AM PM       AM PM       AM PM          AM PM       AM PM       AM PM       AM PM       Total Daily amount of Acetaminophen Do not take more than  3,000 mg per day      Day 4    Time  Name of Medication Number of pills taken  Amount of Acetaminophen  Pain Level   Comments  AM PM       AM PM       AM PM       AM PM       AM PM       AM PM       AM PM       AM PM       Total Daily amount of Acetaminophen Do not take more than  3,000 mg per day      Day 5    Time  Name of Medication Number of pills taken  Amount of Acetaminophen  Pain Level   Comments  AM PM       AM PM       AM PM       AM PM       AM PM       AM PM       AM PM         AM PM       Total Daily amount of Acetaminophen Do not take more than  3,000 mg per day       Day 6    Time  Name of Medication Number of pills taken  Amount of Acetaminophen  Pain Level  Comments  AM PM       AM PM       AM PM       AM PM       AM PM       AM PM       AM PM       AM PM       Total Daily amount of Acetaminophen Do not take more than  3,000 mg per day      Day 7    Time  Name of Medication Number of pills taken  Amount of Acetaminophen  Pain Level   Comments  AM PM       AM PM       AM PM       AM PM       AM PM       AM PM       AM PM       AM PM       Total Daily amount of Acetaminophen Do not take more than  3,000 mg per day        For additional information about how and where to safely dispose of unused opioid medications - https://www.morepowerfulnc.org  Disclaimer: This document contains information and/or instructional materials adapted from [facility]  for the typical patient with your condition. It does not replace medical advice from your health care provider because your experience may differ from that of the typical patient. Talk to your health care provider if you have any questions about this document, your condition or your treatment plan. Adapted from [facility]  

## 2019-12-20 NOTE — Op Note (Signed)
Laparoscopic Cholecystectomy Procedure Note  Indications: This patient presents with symptomatic gallbladder disease and will undergo laparoscopic cholecystectomy.  Pre-operative Diagnosis: Acute and chronic cholecystitis  Post-operative Diagnosis: Same  Surgeon: Ralene Ok, MD  Assistants: Sheria Lang, MD  Anesthesia: General endotracheal anesthesia  ASA Class: 2  Procedure Details  The patient was seen again in the Holding Room. The risks, benefits, complications, treatment options, and expected outcomes were discussed with the patient. The possibilities of reaction to medication, pulmonary aspiration, perforation of viscus, bleeding, recurrent infection, finding a normal gallbladder, the need for additional procedures, failure to diagnose a condition, the possible need to convert to an open procedure, and creating a complication requiring transfusion or operation were discussed with the patient. The likelihood of improving the patient's symptoms with return to their baseline status is good.  The patient and/or family concurred with the proposed plan, giving informed consent. The site of surgery properly noted. The patient was taken to Operating Room, identified as Raven Mclean and the procedure verified as Laparoscopic Cholecystectomy. A Time Out was held and the above information confirmed.  Prior to the induction of general anesthesia, antibiotic prophylaxis was administered. General endotracheal anesthesia was then administered and tolerated well. After the induction, the abdomen was prepped with Chloraprep and draped in sterile fashion. The patient was positioned in the supine position.  Local anesthetic agent was injected into the skin in the right upper quadrant were a 5-mm incision was made an a Veress needle was inserted into the peritoneal cavity. Pneumoperitoneum was then created with CO2 and tolerated well without any adverse changes in the patient's vital signs. The  needle was removed and a 5-mm trocar was placed. An 11-mm port was placed in the supraumbilical position.  Two 5-mm ports were placed in the right upper quadrant and the subxiphoid space. All skin incisions were infiltrated with a local anesthetic agent before making the incision and placing the trocars.   We positioned the patient in reverse Trendelenburg, tilted slightly to the patient's left.  The gallbladder was identified, the fundus grasped and retracted cephalad. Adhesions were lysed bluntly and with the electrocautery where indicated, taking care not to injure any adjacent organs or viscus. The infundibulum was grasped and retracted laterally, exposing the peritoneum overlying the triangle of Calot. This was then divided and exposed. The cystic duct was clearly identified and bluntly dissected circumferentially. A critical view of the cystic duct and cystic artery was obtained.  The cystic duct was then ligated with clips and divided. The cystic artery was, dissected free, ligated with clips and divided as well. Of note, the cystic duct was very short with a lateral common bile duct.  The gallbladder was dissected from the liver bed in retrograde fashion with the electrocautery. The gallbladder was removed and placed in an Eccosac bag. The liver bed was irrigated and inspected. Hemostasis was achieved with the electrocautery. Copious irrigation was utilized and was repeatedly aspirated until clear.  The gallbladder and Eccosac bag were then removed through the umbilical port site, which required enlargement of the fascia opening and the skin due to the large stone burden in the gallbladder.  The fascia at this site was closed with two open 0 Vicryl sutures on a UR6 and three 0 Vicryl sutures placed under laparoscopic vision with a suture passing device.   We again inspected the right upper quadrant for hemostasis.  Pneumoperitoneum was released as we removed the trocars.  4-0 Monocryl was used to close  the  skin. Surgical glue was applied. The patient was then extubated and brought to the recovery room in stable condition. Instrument, sponge, and needle counts were correct at closure and at the conclusion of the case.   Findings: Cholecystitis with Cholelithiasis  Estimated Blood Loss: 20 ml         Drains: None         Specimens: Gallbladder           Complications: None; patient tolerated the procedure well.         Disposition: PACU - hemodynamically stable.         Condition: stable

## 2019-12-20 NOTE — Care Plan (Cosign Needed)
This 64 years old female with medical history significant for chronic back pain,  s/p total right knee replacement 2 weeks ago at [facility], hypertension,  hyperlipidemia presents in the ED with complaints of worsening right upper quadrant abdominal pain associated with nausea and vomiting.  She was found to have gallstones without evidence of acute cholecystitis. Patient is admitted for acute cholecystitis.  General surgery consulted,  Patient is scheduled to have laparoscopic cholecystectomy today.  Patient was seen and examined.  Patient was complaining about nausea and abdominal pain.

## 2019-12-20 NOTE — TOC Initial Note (Signed)
Transition of Care [facility]) - Initial/Assessment Note    Patient Details  Name: Raven Mclean MRN: [id number] Date of Birth: 15-Mar-1955  Transition of Care [facility]) CM/SW Contact:    Marilu Favre, RN Phone Number: 12/20/2019, 3:40 PM  Clinical Narrative:                  Patient from home, No DME   PCP [facility]buere Expected Discharge Plan: Home/Self Care Barriers to Discharge: Continued Medical Work up   Patient Goals and CMS Choice Patient states their goals for this hospitalization and ongoing recovery are:: to return to home CMS Medicare.gov Compare Post Acute Care list provided to:: Patient Choice offered to / list presented to : NA  Expected Discharge Plan and Services Expected Discharge Plan: Home/Self Care   Discharge Planning Services: NA   Living arrangements for the past 2 months: Single Family Home                 DME Arranged: N/A DME Agency: NA       HH Arranged: NA          Prior Living Arrangements/Services Living arrangements for the past 2 months: Single Family Home Lives with:: Relatives Patient language and need for interpreter reviewed:: Yes Do you feel safe going back to the place where you live?: Yes      Need for Family Participation in Patient Care: Yes (Comment) Care giver support system in place?: Yes (comment)   Criminal Activity/Legal Involvement Pertinent to Current Situation/Hospitalization: No - Comment as needed  Activities of Daily Living      Permission Sought/Granted   Permission granted to share information with : No              Emotional Assessment Appearance:: Appears stated age Attitude/Demeanor/Rapport: Lethargic Affect (typically observed): Flat Orientation: : Oriented to Self, Oriented to Place, Oriented to  Time, Oriented to Situation Alcohol / Substance Use: Not Applicable Psych Involvement: No (comment)  Admission diagnosis:  RUQ abdominal pain [R10.11] Symptomatic cholelithiasis  [K80.20] Patient Active Problem List   Diagnosis Date Noted  . RUQ abdominal pain 12/20/2019  . Hypertension 03/15/2011  . Obesity, Class II, BMI 35-39.9, with comorbidity 03/15/2011  . Chronic back pain    PCP:  [facility]:   [facility] [address] ([address]), [place] - [address] [place] ([place]) [place] [address] Phone: [phone] Fax: [phone]     Social Determinants of Health (SDOH) Interventions    Readmission Risk Interventions No flowsheet data found.

## 2019-12-20 NOTE — ED Provider Notes (Signed)
TIME SEEN: 1:16 AM  CHIEF COMPLAINT: Abdominal pain, vomiting  HPI: [name] with history of hypertension, hyperlipidemia who presents to the emergency department with abdominal pain and vomiting.  She reports pain is throughout her abdomen but worse in the right upper quadrant and is sharp, stabbing and severe worse after eating.  She has had nausea and vomiting.  She has also been constipated.  This is her third visit to the emergency department for the same.  She reports increasingly worsening symptoms over the past week unrelieved with oral antiemetics and pain medication at home.  She has been told that she has cholelithiasis and has an appointment to see general surgery on December 10 but does not feel she can wait that long.  Patient recently underwent right knee replacement 2 weeks ago at [facility].  ROS: See HPI Constitutional: no fever  Eyes: no drainage  ENT: no runny nose   Cardiovascular:  no chest pain  Resp: no SOB  GI: no vomiting GU: no dysuria Integumentary: no rash  Allergy: no hives  Musculoskeletal: no leg swelling  Neurological: no slurred speech ROS otherwise negative  PAST MEDICAL HISTORY/PAST SURGICAL HISTORY:  Past Medical History:  Diagnosis Date  . Arthritis   . Blood transfusion   . Chronic back pain   . Hyperlipidemia   . Hypertension   . Neuromuscular disorder ([place])    spasms in legs    MEDICATIONS:  Prior to Admission medications   Medication Sig Start Date End Date Taking? Authorizing Provider  acetaminophen (TYLENOL) 500 MG tablet Take 1,000 mg by mouth in the morning, at noon, and at bedtime.    [provider]  alum & mag hydroxide-simeth (MAALOX MAX) 400-400-40 MG/5ML suspension Take 15 mLs by mouth every 6 (six) hours as needed for indigestion. 12/17/19   Jacqlyn Larsen, PA-C  Ascorbic Acid (VITAMIN C WITH ROSE HIPS) 500 MG tablet Take 500 mg by mouth daily.    [provider]  aspirin 81 MG chewable tablet  Chew 81 mg by mouth in the morning and at bedtime.    [provider]  cholecalciferol (VITAMIN D3) 25 MCG (1000 UNIT) tablet Take 1,000 Units by mouth daily.    [provider]  diclofenac Sodium (VOLTAREN) 1 % GEL Apply 2 g topically 4 (four) times daily.    [provider]  docusate sodium (COLACE) 100 MG capsule Take 100 mg by mouth daily as needed for mild constipation.    [provider]  hydrochlorothiazide (HYDRODIURIL) 25 MG tablet Take 25 mg by mouth daily.    [provider]  lidocaine (LIDODERM) 5 % Place 1-3 patches onto the skin daily. Remove & Discard patch within 12 hours or as directed by MD    [provider]  loratadine (CLARITIN) 10 MG tablet Take 1 tablet (10 mg total) by mouth daily. 03/29/15   Ashley Murrain, NP  metoprolol tartrate (LOPRESSOR) 50 MG tablet Take 50 mg by mouth daily.    [provider]  Multiple Vitamins-Minerals (MULTIVITAMIN WITH MINERALS) tablet Take 1 tablet by mouth daily.    [provider]  ondansetron (ZOFRAN ODT) 4 MG disintegrating tablet 4mg  ODT q4 hours prn nausea/vomit 12/17/19   Jacqlyn Larsen, PA-C  ondansetron (ZOFRAN) 4 MG tablet Take 4 mg by mouth every 8 (eight) hours as needed for nausea or vomiting.    [provider]  oxycodone (OXY-IR) 5 MG capsule Take 5-10 mg by mouth every  4 (four) hours as needed for pain.    [provider]  Phendimetrazine Tartrate 35 MG TABS Take 35 mg by mouth in the morning, at noon, and at bedtime. Patient not taking: Reported on 12/17/2019    [provider]  potassium chloride (KLOR-CON) 10 MEQ tablet Take 10 mEq by mouth daily.    [provider]  simethicone (MYLICON) 80 MG chewable tablet Chew 80 mg by mouth every 6 (six) hours as needed for flatulence.    [provider]  simvastatin (ZOCOR) 20 MG tablet Take 20 mg by mouth daily.    [provider]    ALLERGIES:  Allergies   Allergen Reactions  . Hydrocodone-Acetaminophen Itching    SOCIAL HISTORY:  Social History   Tobacco Use  . Smoking status: Never Smoker  . Smokeless tobacco: Never Used  Substance Use Topics  . Alcohol use: No    FAMILY HISTORY: Family History  Problem Relation Age of Onset  . Heart disease Mother   . Heart disease Brother   . Colon cancer Neg Hx     EXAM: BP (!) 179/101   Pulse 90   Temp (!) 97.4 F (36.3 C) (Oral)   Resp (!) 23   SpO2 100%  CONSTITUTIONAL: Alert and oriented and responds appropriately to questions.  Appears very uncomfortable, moaning in pain, rocking back and forth in bed, dry heaving and burping HEAD: Normocephalic EYES: Conjunctivae clear, pupils appear equal, EOM appear intact ENT: normal nose; moist mucous membranes NECK: Supple, normal ROM CARD: RRR; S1 and S2 appreciated; no murmurs, no clicks, no rubs, no gallops RESP: Normal chest excursion without splinting or tachypnea; breath sounds clear and equal bilaterally; no wheezes, no rhonchi, no rales, no hypoxia or respiratory distress, speaking full sentences ABD/GI: Normal bowel sounds; non-distended; soft, tender throughout the abdomen but worse in the right upper quadrant with voluntary guarding and positive Murphy sign, no rebound BACK:  The back appears normal EXT: Normal ROM in all joints; no deformity noted, no edema; no cyanosis SKIN: Normal color for age and race; warm; no rash on exposed skin NEURO: Moves all extremities equally PSYCH: The patient's mood and manner are appropriate.   MEDICAL DECISION MAKING: Patient here with likely symptomatic cholelithiasis but will obtain repeat ultrasound to evaluate for possible cholecystitis.  Afebrile here.  No leukocytosis.  Normal LFTs and lipase.  Patient does have large ketones in her urine and states she is unable to keep anything down at home despite oral antiemetics.  I suspect that she will need admission and general surgery consultation.   Will give IV fluids, pain and nausea medicine.  ED PROGRESS: Ultrasound shows gallstones without acute cholecystitis but patient is having intractable pain and continued nausea and vomiting despite medications at home.  She does appear more comfortable here but given this is her third ED visit will discuss with general surgery on-call.  Spoke with Dr. Georgette Dover with general surgery who agrees that patient will need to be admitted to medicine and they will plan to take her gallbladder out in the morning.  We will keep her n.p.o. at this time.  Patient agrees with this plan.  3:56 AM Discussed patient's case with hospitalist, Dr. Nevada Crane.  I have recommended admission and patient (and family if present) agree with this plan. Admitting physician will place admission orders.   I reviewed all nursing notes, vitals, pertinent previous records and reviewed/interpreted all EKGs, lab and urine results, imaging (as available).  Raven Mclean was evaluated in Emergency Department on 12/20/2019 for the symptoms described in the history of present illness. She was evaluated in the context of the global COVID-19 pandemic, which necessitated consideration that the patient might be at risk for infection with the SARS-CoV-2 virus that causes COVID-19. Institutional protocols and algorithms that pertain to the evaluation of patients at risk for COVID-19 are in a state of rapid change based on information released by regulatory bodies including the CDC and federal and state organizations. These policies and algorithms were followed during the patient's care in the ED.      Arya Luttrull, Delice Bison, DO 12/20/19 [phone]

## 2019-12-20 NOTE — Anesthesia Preprocedure Evaluation (Signed)
Anesthesia Evaluation  Patient identified by MRN, date of birth, ID band Patient awake    Reviewed: Allergy & Precautions, NPO status , Patient's Chart, lab work & pertinent test results  History of Anesthesia Complications Negative for: history of anesthetic complications  Airway Mallampati: III  TM Distance: >3 FB Neck ROM: Full    Dental  (+) Partial Lower, Partial Upper,    Pulmonary neg pulmonary ROS, neg shortness of breath, neg COPD, neg recent URI,  Covid-19 Nucleic Acid Test Results Lab Results      Component                Value               Date                      SARSCOV2NAA              NEGATIVE            12/20/2019              breath sounds clear to auscultation       Cardiovascular hypertension, Pt. on medications and Pt. on home beta blockers (-) angina(-) Past MI and (-) CHF (-) dysrhythmias  Rhythm:Regular     Neuro/Psych  Neuromuscular disease negative psych ROS   GI/Hepatic Neg liver ROS, SYMPTOMATIC CHOLELITHIASIS  Nauseated with active emesis   Endo/Other  negative endocrine ROS  Renal/GU Lab Results      Component                Value               Date                      CREATININE               0.74                12/20/2019                Musculoskeletal  (+) Arthritis ,   Abdominal   Peds  Hematology  (+) Blood dyscrasia, anemia , Lab Results      Component                Value               Date                      WBC                      4.9                 12/20/2019                HGB                      10.3 (L)            12/20/2019                HCT                      31.8 (L)            12/20/2019                MCV  100.6 (H)           12/20/2019                PLT                      341                 12/20/2019               Anesthesia Other Findings   Reproductive/Obstetrics                              Anesthesia Physical Anesthesia Plan  ASA: III  Anesthesia Plan: General   Post-op Pain Management:    Induction: Intravenous, Rapid sequence and Cricoid pressure planned  PONV Risk Score and Plan: 3 and Ondansetron, Dexamethasone and Scopolamine patch - Pre-op  Airway Management Planned: Oral ETT  Additional Equipment: None  Intra-op Plan:   Post-operative Plan: Extubation in OR  Informed Consent: I have reviewed the patients History and Physical, chart, labs and discussed the procedure including the risks, benefits and alternatives for the proposed anesthesia with the patient or authorized representative who has indicated his/her understanding and acceptance.     Dental advisory given  Plan Discussed with: CRNA and Surgeon  Anesthesia Plan Comments:         Anesthesia Quick Evaluation

## 2019-12-20 NOTE — Care Management CC44 (Signed)
Condition Code 44 Documentation Completed  Patient Details  Name: Raven Mclean MRN: [id number] Date of Birth: Dec 06, 1955   Condition Code 44 given:  Yes Patient signature on Condition Code 44 notice:  Yes Documentation of 2 MD's agreement:  Yes Code 44 added to claim:  Yes    Marilu Favre, RN 12/20/2019, 3:35 PM

## 2019-12-20 NOTE — ED Notes (Signed)
[place], likely admit   Keldric Poyer, Delice Bison, DO 12/20/19 [id number]

## 2019-12-20 NOTE — Transfer of Care (Signed)
Immediate Anesthesia Transfer of Care Note  Patient: Raven Mclean  Procedure(s) Performed: LAPAROSCOPIC CHOLECYSTECTOMY (N/A Abdomen)  Patient Location: PACU  Anesthesia Type:General  Level of Consciousness: awake, alert  and oriented  Airway & Oxygen Therapy: Patient Spontanous Breathing and Patient connected to nasal cannula oxygen  Post-op Assessment: Report given to RN and Post -op Vital signs reviewed and stable  Post vital signs: Reviewed and stable  Last Vitals:  Vitals Value Taken Time  BP 158/146 12/20/19 1230  Temp    Pulse 90 12/20/19 1229  Resp 22 12/20/19 1230  SpO2 95 % 12/20/19 1229  Vitals shown include unvalidated device data.  Last Pain:  Vitals:   12/20/19 0621  TempSrc: Oral  PainSc:          Complications: No complications documented.

## 2019-12-20 NOTE — H&P (Signed)
Consult Note  MARLIS OLDAKER [birth year]  [id number].    Requesting MD: Nevada Crane Chief Complaint/Reason for Consult: Cholelithiasis  HPI:  [name] who presented to [facility] yesterday with 2 week history of RUQ pain with nausea and vomiting. Patient had been worked up for this at [facility] and has known cholelithiasis and was scheduled to see a general surgeon in [place] office 12/10 but did not feel she could wait that long. She reports n/v with any PO intake. She denies fever, chills, chest pain, SOB, urinary symptoms. She has been constipated. PMH otherwise significant for HTN, HLD, chronic back pain and recent knee replacement 2 weeks ago. She has been on a daily baby aspirin since knee surgery but no other blood thinners. Past abdominal surgery includes cesarean section. Allergy listed to vicodin with itching as reaction.   ROS: Review of Systems  Constitutional: Negative for chills and fever.  Respiratory: Negative for shortness of breath and wheezing.   Cardiovascular: Negative for chest pain and palpitations.  Gastrointestinal: Positive for abdominal pain, constipation, nausea and vomiting. Negative for blood in stool, diarrhea and melena.  Genitourinary: Negative for dysuria, frequency and urgency.  Musculoskeletal: Positive for joint pain.  All other systems reviewed and are negative.   Family History  Problem Relation Age of Onset  . Heart disease Mother   . Heart disease Brother   . Colon cancer Neg Hx     Past Medical History:  Diagnosis Date  . Arthritis   . Blood transfusion   . Chronic back pain   . Hyperlipidemia   . Hypertension   . Neuromuscular disorder (HCC)    spasms in legs    Past Surgical History:  Procedure Laterality Date  . BACK SURGERY    . CESAREAN SECTION    . COLONOSCOPY    . POLYPECTOMY    . TRIGGER FINGER RELEASE      Social History:  reports that she has never smoked. She has never used smokeless tobacco. She reports  that she does not drink alcohol and does not use drugs.  Allergies:  Allergies  Allergen Reactions  . Hydrocodone-Acetaminophen Itching    Facility-Administered Medications Prior to Admission  Medication Dose Route Frequency Provider Last Rate Last Admin  . 0.9 %  sodium chloride infusion  500 mL Intravenous Continuous Milus Banister, MD       Medications Prior to Admission  Medication Sig Dispense Refill  . acetaminophen (TYLENOL) 500 MG tablet Take 1,000 mg by mouth in the morning, at noon, and at bedtime.    Marland Kitchen alum & mag hydroxide-simeth (MAALOX MAX) 400-400-40 MG/5ML suspension Take 15 mLs by mouth every 6 (six) hours as needed for indigestion. 355 mL 0  . Ascorbic Acid (VITAMIN C WITH ROSE HIPS) 500 MG tablet Take 500 mg by mouth daily.    Marland Kitchen aspirin 81 MG chewable tablet Chew 81 mg by mouth in the morning and at bedtime.    . cholecalciferol (VITAMIN D3) 25 MCG (1000 UNIT) tablet Take 1,000 Units by mouth daily.    . diclofenac Sodium (VOLTAREN) 1 % GEL Apply 2 g topically 4 (four) times daily.    Marland Kitchen docusate sodium (COLACE) 100 MG capsule Take 100 mg by mouth daily as needed for mild constipation.    . hydrochlorothiazide (HYDRODIURIL) 25 MG tablet Take 25 mg by mouth daily.    Marland Kitchen lidocaine (LIDODERM) 5 % Place 1-3 patches onto the skin daily. Remove &  Discard patch within 12 hours or as directed by MD    . loratadine (CLARITIN) 10 MG tablet Take 1 tablet (10 mg total) by mouth daily. 14 tablet 0  . metoprolol tartrate (LOPRESSOR) 50 MG tablet Take 50 mg by mouth daily.    . Multiple Vitamins-Minerals (MULTIVITAMIN WITH MINERALS) tablet Take 1 tablet by mouth daily.    . ondansetron (ZOFRAN ODT) 4 MG disintegrating tablet 4mg  ODT q4 hours prn nausea/vomit 10 tablet 0  . ondansetron (ZOFRAN) 4 MG tablet Take 4 mg by mouth every 8 (eight) hours as needed for nausea or vomiting.    Marland Kitchen oxycodone (OXY-IR) 5 MG capsule Take 5-10 mg by mouth every 4 (four) hours as needed for pain.    Marland Kitchen  Phendimetrazine Tartrate 35 MG TABS Take 35 mg by mouth in the morning, at noon, and at bedtime. (Patient not taking: Reported on 12/17/2019)    . potassium chloride (KLOR-CON) 10 MEQ tablet Take 10 mEq by mouth daily.    . simethicone (MYLICON) 80 MG chewable tablet Chew 80 mg by mouth every 6 (six) hours as needed for flatulence.    . simvastatin (ZOCOR) 20 MG tablet Take 20 mg by mouth daily.      Blood pressure 139/90, pulse 88, temperature 99.2 F (37.3 C), temperature source Oral, resp. rate 18, SpO2 100 %. Physical Exam:  General: pleasant, WD, WN female who is actively vomiting HEENT:  Sclera are anicteric.  PERRL.  Ears and nose without any masses or lesions.  Mouth is pink and moist Heart: regular, rate, and rhythm.  Normal s1,s2. No obvious murmurs, gallops, or rubs noted.  Palpable radial and pedal pulses bilaterally Lungs: CTAB, no wheezes, rhonchi, or rales noted.  Respiratory effort nonlabored Abd: soft, mildly ttp in periumbilical abdomen, ND, +BS, no masses, hernias, or organomegaly MS: all 4 extremities are symmetrical with no cyanosis, clubbing, or edema. Skin: warm and dry with no masses, lesions, or rashes Neuro: Cranial nerves 2-12 grossly intact, sensation is normal throughout Psych: A&Ox3 with an appropriate affect.   Results for orders placed or performed during the hospital encounter of 12/19/19 (from the past 48 hour(s))  Urinalysis, Routine w reflex microscopic Urine, Clean Catch     Status: Abnormal   Collection Time: 12/19/19  7:54 PM  Result Value Ref Range   Color, Urine YELLOW YELLOW   APPearance CLEAR CLEAR   Specific Gravity, Urine 1.018 1.005 - 1.030   pH 7.0 5.0 - 8.0   Glucose, UA NEGATIVE NEGATIVE mg/dL   Hgb urine dipstick NEGATIVE NEGATIVE   Bilirubin Urine NEGATIVE NEGATIVE   Ketones, ur 80 (A) NEGATIVE mg/dL   Protein, ur NEGATIVE NEGATIVE mg/dL   Nitrite NEGATIVE NEGATIVE   Leukocytes,Ua NEGATIVE NEGATIVE    Comment: Performed at [facility] [address], [place], [place] [address]  Lipase, blood     Status: None   Collection Time: 12/19/19  8:19 PM  Result Value Ref Range   Lipase 34 11 - 51 U/L    Comment: Performed at [facility] [address] [place], [place] [address]  Comprehensive metabolic panel     Status: Abnormal   Collection Time: 12/19/19  8:19 PM  Result Value Ref Range   Sodium 135 135 - 145 mmol/L   Potassium 3.1 (L) 3.5 - 5.1 mmol/L   Chloride 97 (L) 98 - 111 mmol/L   CO2 22 22 - 32 mmol/L   Glucose, Bld 125 (H) 70 -  99 mg/dL    Comment: Glucose reference range applies only to samples taken after fasting for at least 8 hours.   BUN 10 8 - 23 mg/dL   Creatinine, Ser 0.88 0.44 - 1.00 mg/dL   Calcium 9.7 8.9 - 10.3 mg/dL   Total Protein 7.4 6.5 - 8.1 g/dL   Albumin 4.0 3.5 - 5.0 g/dL   AST 19 15 - 41 U/L   ALT 9 0 - 44 U/L   Alkaline Phosphatase 54 38 - 126 U/L   Total Bilirubin 0.9 0.3 - 1.2 mg/dL   GFR, Estimated >60 >60 mL/min    Comment: (NOTE) Calculated using the CKD-EPI Creatinine Equation (2021)    Anion gap 16 (H) 5 - 15    Comment: Performed at [facility] [address], [place], [place] [address]  CBC     Status: Abnormal   Collection Time: 12/19/19  8:19 PM  Result Value Ref Range   WBC 7.3 4.0 - 10.5 K/uL   RBC 3.52 (L) 3.87 - 5.11 MIL/uL   Hemoglobin 11.4 (L) 12.0 - 15.0 g/dL   HCT 35.6 (L) 36 - 46 %   MCV 101.1 (H) 80.0 - 100.0 fL   MCH 32.4 26.0 - 34.0 pg   MCHC 32.0 30.0 - 36.0 g/dL   RDW 12.8 11.5 - 15.5 %   Platelets 390 150 - 400 K/uL   nRBC 0.0 0.0 - 0.2 %    Comment: Performed at [facility], [place] [address] [place], [place] [address]  Respiratory Panel by RT PCR (Flu A&B, Covid) - Nasopharyngeal Swab     Status: None   Collection Time: 12/20/19  4:03 AM   Specimen: Nasopharyngeal Swab; Nasopharyngeal(NP) swabs in vial transport medium  Result Value Ref Range   SARS Coronavirus 2 by RT PCR NEGATIVE NEGATIVE    Comment:  (NOTE) SARS-CoV-2 target nucleic acids are NOT DETECTED.  The SARS-CoV-2 RNA is generally detectable in upper respiratoy specimens during the acute phase of infection. The lowest concentration of SARS-CoV-2 viral copies this assay can detect is 131 copies/mL. A negative result does not preclude SARS-Cov-2 infection and should not be used as the sole basis for treatment or other patient management decisions. A negative result may occur with  improper specimen collection/handling, submission of specimen other than nasopharyngeal swab, presence of viral mutation(s) within the areas targeted by this assay, and inadequate number of viral copies (<131 copies/mL). A negative result must be combined with clinical observations, patient history, and epidemiological information. The expected result is Negative.  Fact Sheet for Patients:  PinkCheek.be  Fact Sheet for Healthcare Providers:  GravelBags.it  This test is no t yet approved or cleared by the [place] FDA and  has been authorized for detection and/or diagnosis of SARS-CoV-2 by FDA under an Emergency Use Authorization (EUA). This EUA will remain  in effect (meaning this test can be used) for the duration of the COVID-19 declaration under Section 564(b)(1) of the Act, 21 U.S.C. section 360bbb-3(b)(1), unless the authorization is terminated or revoked sooner.     Influenza A by PCR NEGATIVE NEGATIVE   Influenza B by PCR NEGATIVE NEGATIVE    Comment: (NOTE) The Xpert Xpress SARS-CoV-2/FLU/RSV assay is intended as an aid in  the diagnosis of influenza from Nasopharyngeal swab specimens and  should not be used as a sole basis for treatment. Nasal washings and  aspirates are unacceptable for Xpert Xpress SARS-CoV-2/FLU/RSV  testing.  Fact Sheet for Patients:  PinkCheek.be  Fact Sheet for Healthcare  Providers: GravelBags.it  This test is not yet approved or cleared by the [place] FDA and  has been authorized for detection and/or diagnosis of SARS-CoV-2 by  FDA under an Emergency Use Authorization (EUA). This EUA will remain  in effect (meaning this test can be used) for the duration of the  Covid-19 declaration under Section 564(b)(1) of the Act, 21  U.S.C. section 360bbb-3(b)(1), unless the authorization is  terminated or revoked. Performed at [facility], [place] [address], [place], [place] [address]   Magnesium     Status: None   Collection Time: 12/20/19  4:53 AM  Result Value Ref Range   Magnesium 1.7 1.7 - 2.4 mg/dL    Comment: Performed at [facility] [address] [place], [place] [address]  Comprehensive metabolic panel     Status: Abnormal   Collection Time: 12/20/19  4:53 AM  Result Value Ref Range   Sodium 135 135 - 145 mmol/L   Potassium 3.1 (L) 3.5 - 5.1 mmol/L   Chloride 98 98 - 111 mmol/L   CO2 25 22 - 32 mmol/L   Glucose, Bld 138 (H) 70 - 99 mg/dL    Comment: Glucose reference range applies only to samples taken after fasting for at least 8 hours.   BUN 9 8 - 23 mg/dL   Creatinine, Ser 0.74 0.44 - 1.00 mg/dL   Calcium 9.0 8.9 - 10.3 mg/dL   Total Protein 6.7 6.5 - 8.1 g/dL   Albumin 3.7 3.5 - 5.0 g/dL   AST 18 15 - 41 U/L   ALT 9 0 - 44 U/L   Alkaline Phosphatase 51 38 - 126 U/L   Total Bilirubin 0.7 0.3 - 1.2 mg/dL   GFR, Estimated >60 >60 mL/min    Comment: (NOTE) Calculated using the CKD-EPI Creatinine Equation (2021)    Anion gap 12 5 - 15    Comment: Performed at [facility] [address] [place], [place] [address]  CBC     Status: Abnormal   Collection Time: 12/20/19  4:53 AM  Result Value Ref Range   WBC 4.9 4.0 - 10.5 K/uL   RBC 3.16 (L) 3.87 - 5.11 MIL/uL   Hemoglobin 10.3 (L) 12.0 - 15.0 g/dL   HCT 31.8 (L) 36 - 46 %   MCV 100.6 (H) 80.0 - 100.0 fL   MCH 32.6 26.0 - 34.0 pg    MCHC 32.4 30.0 - 36.0 g/dL   RDW 12.8 11.5 - 15.5 %   Platelets 341 150 - 400 K/uL   nRBC 0.0 0.0 - 0.2 %    Comment: Performed at [facility], [place] [address], [place], [place] [address]   US Abdomen Limited RUQ (LIVER/GB)  Result Date: 12/20/2019 CLINICAL DATA:  Right upper quadrant pain, nausea, vomiting EXAM: ULTRASOUND ABDOMEN LIMITED RIGHT UPPER QUADRANT COMPARISON:  12/17/2019 FINDINGS: Gallbladder: Multiple mobile gallstones are again seen within the gallbladder measuring up to 2.2 cm in greatest dimension. The gallbladder, however, is not distended, there is no gallbladder wall thickening, and no pericholecystic fluid is identified. The sonographic Percell Miller sign is reportedly negative. Common bile duct: Diameter: 2 mm in proximal diameter Liver: No focal lesion identified. Within normal limits in parenchymal echogenicity. Portal vein is patent on color Doppler imaging with normal direction of blood flow towards the liver. Other: None. IMPRESSION: Cholelithiasis without sonographic evidence of acute cholecystitis Electronically Signed   By: Cassandria Anger  Christa See MD   On: 12/20/2019 03:23      Assessment/Plan HTN HLD Recent knee replacement Chronic back pain  Symptomatic cholelithiasis - US shows stones but not cholecystitis  - LFTs normal and no leukocytosis - plan for OR today for lap chole - procedure explained to patient along with risks and patient is agreeable to proceed  FEN: NPO, IVF VTE: SCDs ID: [email or web address] pre-op  Norm Parcel, [facility] Surgery 12/20/2019, 7:28 AM Please see Amion for pager number during day hours 7:00am-4:30pm

## 2019-12-21 ENCOUNTER — Other Ambulatory Visit: Payer: Self-pay

## 2019-12-21 DIAGNOSIS — R1011 Right upper quadrant pain: Secondary | ICD-10-CM | POA: Diagnosis not present

## 2019-12-21 LAB — CBC
HCT: 34.3 % — ABNORMAL LOW (ref 36.0–46.0)
Hemoglobin: 10.9 g/dL — ABNORMAL LOW (ref 12.0–15.0)
MCH: 32 pg (ref 26.0–34.0)
MCHC: 31.8 g/dL (ref 30.0–36.0)
MCV: 100.6 fL — ABNORMAL HIGH (ref 80.0–100.0)
Platelets: 312 10*3/uL (ref 150–400)
RBC: 3.41 MIL/uL — ABNORMAL LOW (ref 3.87–5.11)
RDW: 13.1 % (ref 11.5–15.5)
WBC: 7.2 10*3/uL (ref 4.0–10.5)
nRBC: 0 % (ref 0.0–0.2)

## 2019-12-21 LAB — COMPREHENSIVE METABOLIC PANEL
ALT: 20 U/L (ref 0–44)
AST: 39 U/L (ref 15–41)
Albumin: 3.5 g/dL (ref 3.5–5.0)
Alkaline Phosphatase: 52 U/L (ref 38–126)
Anion gap: 11 (ref 5–15)
BUN: 12 mg/dL (ref 8–23)
CO2: 23 mmol/L (ref 22–32)
Calcium: 9.2 mg/dL (ref 8.9–10.3)
Chloride: 102 mmol/L (ref 98–111)
Creatinine, Ser: 0.8 mg/dL (ref 0.44–1.00)
GFR, Estimated: >60 mL/min (ref >60)
Glucose, Bld: 119 mg/dL — ABNORMAL HIGH (ref 70–99)
Potassium: 3.5 mmol/L (ref 3.5–5.1)
Sodium: 136 mmol/L (ref 135–145)
Total Bilirubin: 0.9 mg/dL (ref 0.3–1.2)
Total Protein: 6.8 g/dL (ref 6.5–8.1)

## 2019-12-21 LAB — MAGNESIUM: Magnesium: 1.9 mg/dL (ref 1.7–2.4)

## 2019-12-21 LAB — PHOSPHORUS: Phosphorus: 3.8 mg/dL (ref 2.5–4.6)

## 2019-12-21 MED ORDER — TRAMADOL HCL 50 MG PO TABS
50.0000 mg | ORAL_TABLET | Freq: Four times a day (QID) | ORAL | Status: DC | PRN
  Administered 2019-12-21 – 2019-12-23 (×5): 50 mg via ORAL
  Filled 2019-12-21 (×5): qty 1

## 2019-12-21 MED ORDER — METOCLOPRAMIDE HCL 5 MG/ML IJ SOLN
10.0000 mg | Freq: Four times a day (QID) | INTRAMUSCULAR | Status: AC
  Administered 2019-12-21 (×2): 10 mg via INTRAVENOUS
  Filled 2019-12-21 (×2): qty 2

## 2019-12-21 MED ORDER — PNEUMOCOCCAL VAC POLYVALENT 25 MCG/0.5ML IJ INJ
0.5000 mL | INJECTION | Freq: Once | INTRAMUSCULAR | Status: AC
  Administered 2019-12-25: 0.5 mL via INTRAMUSCULAR
  Filled 2019-12-21: qty 0.5

## 2019-12-21 NOTE — Progress Notes (Signed)
1 Day Post-Op   Subjective/Chief Complaint: Some n/v Pain better than admission    Objective: Vital signs in last 24 hours: Temp:  [98.3 F (36.8 C)-100.5 F (38.1 C)] 99.3 F (37.4 C) (11/20 0459) Pulse Rate:  [74-97] 89 (11/20 0600) Resp:  [12-18] 16 (11/20 0459) BP: (141-168)/(72-146) 152/84 (11/20 0600) SpO2:  [92 %-100 %] 93 % (11/20 0459) Weight:  [90.5 kg] 90.5 kg (11/19 1332) Last BM Date: 12/19/19  Intake/Output from previous day: 11/19 0701 - 11/20 0700 In: 2428.8 [P.O.:100; I.V.:2180; IV Piggyback:148.8] Out: 325 [Urine:300; Blood:25] Intake/Output this shift: No intake/output data recorded.  General appearance: alert, cooperative and appears stated age GI: soft, non-tender; bowel sounds normal; no masses,  no organomegaly and inc c/d/i  Lab Results:  Recent Labs    12/20/19 0453 12/21/19 0242  WBC 4.9 7.2  HGB 10.3* 10.9*  HCT 31.8* 34.3*  PLT 341 312   BMET Recent Labs    12/20/19 0453 12/21/19 0242  NA 135 136  K 3.1* 3.5  CL 98 102  CO2 25 23  GLUCOSE 138* 119*  BUN 9 12  CREATININE 0.74 0.80  CALCIUM 9.0 9.2   PT/INR Recent Labs    12/20/19 0721  LABPROT 13.6  INR 1.1   ABG No results for input(s): PHART, HCO3 in the last 72 hours.  Invalid input(s): PCO2, PO2  Studies/Results: US Abdomen Limited RUQ (LIVER/GB)  Result Date: 12/20/2019 CLINICAL DATA:  Right upper quadrant pain, nausea, vomiting EXAM: ULTRASOUND ABDOMEN LIMITED RIGHT UPPER QUADRANT COMPARISON:  12/17/2019 FINDINGS: Gallbladder: Multiple mobile gallstones are again seen within the gallbladder measuring up to 2.2 cm in greatest dimension. The gallbladder, however, is not distended, there is no gallbladder wall thickening, and no pericholecystic fluid is identified. The sonographic Percell Miller sign is reportedly negative. Common bile duct: Diameter: 2 mm in proximal diameter Liver: No focal lesion identified. Within normal limits in parenchymal echogenicity. Portal vein is  patent on color Doppler imaging with normal direction of blood flow towards the liver. Other: None. IMPRESSION: Cholelithiasis without sonographic evidence of acute cholecystitis Electronically Signed   By: Fidela Salisbury MD   On: 12/20/2019 03:23    Anti-infectives: Anti-infectives (From admission, onward)   Start     Dose/Rate Route Frequency Ordered Stop   12/20/19 0800  cefTRIAXone (ROCEPHIN) 2 g in sodium chloride 0.9 % 100 mL IVPB        2 g 200 mL/hr over 30 Minutes Intravenous Every 24 hours 12/20/19 0739        Assessment/Plan: s/p Procedure(s): LAPAROSCOPIC CHOLECYSTECTOMY (N/A) ADAT reglan for n/v Add tramadol Mobilize Home today or tomorrow   LOS: 1 day    Ralene Ok 12/21/2019

## 2019-12-21 NOTE — Progress Notes (Signed)
Pt ambulated about 60 meters on unit this pm

## 2019-12-21 NOTE — Progress Notes (Signed)
PROGRESS NOTE    Raven Mclean  [id number] DOB: 1955/04/22 DOA: 12/19/2019 PCP: [facility], [facility] (In[facility])    Brief Narrative:  This 64 years old female with medical history significant for chronic back pain,  s/p total right knee replacement 2 weeks ago at [facility], hypertension,  hyperlipidemia presents in the ED with complaints of worsening right upper quadrant abdominal pain associated with nausea and vomiting.  She was found to have gallstones without evidence of acute cholecystitis. Patient is admitted for acute cholecystitis.  General surgery consulted, Patient underwent laparoscopic cholecystectomy,  tolerated well but continues to have nausea and vomiting.  Assessment & Plan:   Active Problems:   RUQ abdominal pain  Right upper quadrant abdominal pain in the setting of cholelithiasis She had a CT abdomen and pelvis with contrast done on 12/17/2019 which showed cholelithiasis with mild gallbladder distention.  No specific evidence of acute cholecystitis or biliary duct dilatation. Right upper quadrant abdominal ultrasound done on 12/20/2019 showed cholelithiasis without sonographic evidence of acute cholecystitis. General surgery Dr. Erlinda Hong was consulted by EDP Patient underwent laparoscopic cholecystectomy, tolerated well. IV fluid, IV antiemetics PRN Pain control PRN, add scheduled bowel regimen. Started clear liquid diet,  continue to remain nauseous, will advance as tolerated.  Refractory nausea and vomiting likely secondary to above IV antiemetics as needed IV fluids  Hypokalemia >> improved Serum potassium 3.1, goal 4.0 Continue  IV normal saline KCl 40 mEq 100 cc/h Repeat BMP  Essential hypertension BP stable. Resume home oral antihypertensives, Lopressor 12.5 mg daily Hold home HCTZ IV Lopressor PRN with parameters.  Hyperlipidemia Resume home simvastatin  Chronic constipation, likely opiate induced Bowel regimen    DVT prophylaxis: SCDs Code  Status: Full code Family Communication: No one at bedside Disposition Plan:  Status is: Observation  The patient remains OBS appropriate and will d/c before 2 midnights.  Dispo:  Patient From: Home  Planned Disposition: Home  Expected discharge date: 12/22/19 Medically stable for discharge: No  Consultants:   General surgery  Procedures: Laparoscopic cholecystectomy Antimicrobials:  Anti-infectives (From admission, onward)   Start     Dose/Rate Route Frequency Ordered Stop   12/20/19 0800  cefTRIAXone (ROCEPHIN) 2 g in sodium chloride 0.9 % 100 mL IVPB        2 g 200 mL/hr over 30 Minutes Intravenous Every 24 hours 12/20/19 0739        Subjective: Patient was seen and examined at bedside.  Overnight events noted.  Patient is status post laparoscopic cholecystectomy.  Patient continues to have nausea and vomiting.  Also reports having mild abdominal tenderness.  Objective: Vitals:   12/21/19 0459 12/21/19 0600 12/21/19 1051 12/21/19 1435  BP: (!) 168/88 (!) 152/84 (!) 161/74 (!) 174/84  Pulse: 89 89 87 91  Resp: 16  18 19   Temp: 99.3 F (37.4 C)  98.3 F (36.8 C) 97.6 F (36.4 C)  TempSrc: Oral  Oral Oral  SpO2: 93%  97% 97%  Weight:      Height:        Intake/Output Summary (Last 24 hours) at 12/21/2019 1449 Last data filed at 12/21/2019 1436 Gross per 24 hour  Intake 1578.78 ml  Output 300 ml  Net 1278.78 ml   Filed Weights   12/20/19 1332  Weight: 90.5 kg    Examination:  General exam: Appears calm and comfortable.  Not in acute distress Respiratory system: Clear to auscultation. Respiratory effort normal. Cardiovascular system: S1 & S2 heard, RRR. No JVD, murmurs,  rubs, gallops or clicks. No pedal edema. Gastrointestinal system: Abdomen is nondistended, soft and  Mildly tender. No organomegaly or masses felt.  Normal bowel sounds heard.  Laparoscopic surgical scars noted. Central nervous system: Alert and oriented. No focal neurological  deficits. Extremities: No clubbing,  no edema, no cyanosis. Skin: No rashes, lesions or ulcers Psychiatry: Judgement and insight appear normal. Mood & affect appropriate.     Data Reviewed: I have personally reviewed following labs and imaging studies  CBC: Recent Labs  Lab 12/17/19 1149 12/19/19 2019 12/20/19 0453 12/21/19 0242  WBC 4.3 7.3 4.9 7.2  HGB 11.0* 11.4* 10.3* 10.9*  HCT 34.9* 35.6* 31.8* 34.3*  MCV 102.3* 101.1* 100.6* 100.6*  PLT 387 390 341 465   Basic Metabolic Panel: Recent Labs  Lab 12/17/19 1149 12/19/19 2019 12/20/19 0453 12/21/19 0242  NA 140 135 135 136  K 3.6 3.1* 3.1* 3.5  CL 101 97* 98 102  CO2 27 22 25 23   GLUCOSE 103* 125* 138* 119*  BUN 10 10 9 12   CREATININE 0.80 0.88 0.74 0.80  CALCIUM 10.1 9.7 9.0 9.2  MG  --   --  1.7 1.9  PHOS  --   --   --  3.8   GFR: Estimated Creatinine Clearance: 71.8 mL/min (by C-G formula based on SCr of 0.8 mg/dL). Liver Function Tests: Recent Labs  Lab 12/17/19 1149 12/19/19 2019 12/20/19 0453 12/21/19 0242  AST 17 19 18  39  ALT 9 9 9 20   ALKPHOS 60 54 51 52  BILITOT 0.6 0.9 0.7 0.9  PROT 7.2 7.4 6.7 6.8  ALBUMIN 3.9 4.0 3.7 3.5   Recent Labs  Lab 12/17/19 1149 12/19/19 2019  LIPASE 37 34   No results for input(s): AMMONIA in the last 168 hours. Coagulation Profile: Recent Labs  Lab 12/20/19 0721  INR 1.1   Cardiac Enzymes: No results for input(s): CKTOTAL, CKMB, CKMBINDEX, TROPONINI in the last 168 hours. BNP (last 3 results) No results for input(s): PROBNP in the last 8760 hours. HbA1C: No results for input(s): HGBA1C in the last 72 hours. CBG: No results for input(s): GLUCAP in the last 168 hours. Lipid Profile: No results for input(s): CHOL, HDL, LDLCALC, TRIG, CHOLHDL, LDLDIRECT in the last 72 hours. Thyroid Function Tests: No results for input(s): TSH, T4TOTAL, FREET4, T3FREE, THYROIDAB in the last 72 hours. Anemia Panel: No results for input(s): VITAMINB12, FOLATE,  FERRITIN, TIBC, IRON, RETICCTPCT in the last 72 hours. Sepsis Labs: No results for input(s): PROCALCITON, LATICACIDVEN in the last 168 hours.  Recent Results (from the past 240 hour(s))  Respiratory Panel by RT PCR (Flu A&B, Covid) - Nasopharyngeal Swab     Status: None   Collection Time: 12/20/19  4:03 AM   Specimen: Nasopharyngeal Swab; Nasopharyngeal(NP) swabs in vial transport medium  Result Value Ref Range Status   SARS Coronavirus 2 by RT PCR NEGATIVE NEGATIVE Final    Comment: (NOTE) SARS-CoV-2 target nucleic acids are NOT DETECTED.  The SARS-CoV-2 RNA is generally detectable in upper respiratoy specimens during the acute phase of infection. The lowest concentration of SARS-CoV-2 viral copies this assay can detect is 131 copies/mL. A negative result does not preclude SARS-Cov-2 infection and should not be used as the sole basis for treatment or other patient management decisions. A negative result may occur with  improper specimen collection/handling, submission of specimen other than nasopharyngeal swab, presence of viral mutation(s) within the areas targeted by this assay, and inadequate number of viral copies (<131  copies/mL). A negative result must be combined with clinical observations, patient history, and epidemiological information. The expected result is Negative.  Fact Sheet for Patients:  PinkCheek.be  Fact Sheet for Healthcare Providers:  GravelBags.it  This test is no t yet approved or cleared by the [place] FDA and  has been authorized for detection and/or diagnosis of SARS-CoV-2 by FDA under an Emergency Use Authorization (EUA). This EUA will remain  in effect (meaning this test can be used) for the duration of the COVID-19 declaration under Section 564(b)(1) of the Act, 21 U.S.C. section 360bbb-3(b)(1), unless the authorization is terminated or revoked sooner.     Influenza A by PCR NEGATIVE  NEGATIVE Final   Influenza B by PCR NEGATIVE NEGATIVE Final    Comment: (NOTE) The Xpert Xpress SARS-CoV-2/FLU/RSV assay is intended as an aid in  the diagnosis of influenza from Nasopharyngeal swab specimens and  should not be used as a sole basis for treatment. Nasal washings and  aspirates are unacceptable for Xpert Xpress SARS-CoV-2/FLU/RSV  testing.  Fact Sheet for Patients: PinkCheek.be  Fact Sheet for Healthcare Providers: GravelBags.it  This test is not yet approved or cleared by the [place] FDA and  has been authorized for detection and/or diagnosis of SARS-CoV-2 by  FDA under an Emergency Use Authorization (EUA). This EUA will remain  in effect (meaning this test can be used) for the duration of the  Covid-19 declaration under Section 564(b)(1) of the Act, 21  U.S.C. section 360bbb-3(b)(1), unless the authorization is  terminated or revoked. Performed at [facility], [place] [address] [place], [place] [address]          Radiology Studies: US Abdomen Limited RUQ (LIVER/GB)  Result Date: 12/20/2019 CLINICAL DATA:  Right upper quadrant pain, nausea, vomiting EXAM: ULTRASOUND ABDOMEN LIMITED RIGHT UPPER QUADRANT COMPARISON:  12/17/2019 FINDINGS: Gallbladder: Multiple mobile gallstones are again seen within the gallbladder measuring up to 2.2 cm in greatest dimension. The gallbladder, however, is not distended, there is no gallbladder wall thickening, and no pericholecystic fluid is identified. The sonographic Percell Miller sign is reportedly negative. Common bile duct: Diameter: 2 mm in proximal diameter Liver: No focal lesion identified. Within normal limits in parenchymal echogenicity. Portal vein is patent on color Doppler imaging with normal direction of blood flow towards the liver. Other: None. IMPRESSION: Cholelithiasis without sonographic evidence of acute cholecystitis Electronically Signed   By: Fidela Salisbury MD   On: 12/20/2019 03:23        Scheduled Meds: . vitamin C with rose hips  500 mg Oral Daily  . cholecalciferol  1,000 Units Oral Daily  . enoxaparin (LOVENOX) injection  40 mg Subcutaneous Q24H  . hydrochlorothiazide  25 mg Oral Daily  . loratadine  10 mg Oral Daily  . metoCLOPramide (REGLAN) injection  10 mg Intravenous Q6H  . metoprolol tartrate  12.5 mg Oral Daily  . multivitamin with minerals  1 tablet Oral Daily  . pneumococcal 23 valent vaccine  0.5 mL Intramuscular Once  . potassium chloride  10 mEq Oral Daily  . senna-docusate  2 tablet Oral BID  . simvastatin  20 mg Oral Daily   Continuous Infusions: . sodium chloride Stopped (12/20/19 1551)  . sodium chloride 50 mL/hr at 12/20/19 1840  . 0.9 % NaCl with KCl 40 mEq / L 100 mL/hr at 12/20/19 0503  . cefTRIAXone (ROCEPHIN)  IV 2 g (12/21/19 0829)  . lactated ringers 10 mL/hr at 12/20/19 1024  . magnesium sulfate  bolus IVPB       LOS: 1 day    Time spent: 25 mins    Shawna Clamp, MD [facility]   If 7PM-7AM, please contact night-coverage

## 2019-12-22 DIAGNOSIS — R1011 Right upper quadrant pain: Secondary | ICD-10-CM | POA: Diagnosis present

## 2019-12-22 DIAGNOSIS — G8929 Other chronic pain: Secondary | ICD-10-CM | POA: Diagnosis present

## 2019-12-22 DIAGNOSIS — K828 Other specified diseases of gallbladder: Secondary | ICD-10-CM | POA: Diagnosis present

## 2019-12-22 DIAGNOSIS — I10 Essential (primary) hypertension: Secondary | ICD-10-CM | POA: Diagnosis present

## 2019-12-22 DIAGNOSIS — E876 Hypokalemia: Secondary | ICD-10-CM | POA: Diagnosis present

## 2019-12-22 DIAGNOSIS — K8012 Calculus of gallbladder with acute and chronic cholecystitis without obstruction: Secondary | ICD-10-CM | POA: Diagnosis present

## 2019-12-22 DIAGNOSIS — E669 Obesity, unspecified: Secondary | ICD-10-CM | POA: Diagnosis present

## 2019-12-22 DIAGNOSIS — E871 Hypo-osmolality and hyponatremia: Secondary | ICD-10-CM | POA: Diagnosis not present

## 2019-12-22 DIAGNOSIS — Z23 Encounter for immunization: Secondary | ICD-10-CM | POA: Diagnosis present

## 2019-12-22 DIAGNOSIS — R112 Nausea with vomiting, unspecified: Secondary | ICD-10-CM | POA: Diagnosis not present

## 2019-12-22 DIAGNOSIS — M549 Dorsalgia, unspecified: Secondary | ICD-10-CM | POA: Diagnosis present

## 2019-12-22 DIAGNOSIS — Z20822 Contact with and (suspected) exposure to covid-19: Secondary | ICD-10-CM | POA: Diagnosis present

## 2019-12-22 DIAGNOSIS — K8020 Calculus of gallbladder without cholecystitis without obstruction: Secondary | ICD-10-CM | POA: Diagnosis not present

## 2019-12-22 DIAGNOSIS — Z96651 Presence of right artificial knee joint: Secondary | ICD-10-CM | POA: Diagnosis present

## 2019-12-22 DIAGNOSIS — Z6837 Body mass index (BMI) 37.0-37.9, adult: Secondary | ICD-10-CM | POA: Diagnosis not present

## 2019-12-22 DIAGNOSIS — E785 Hyperlipidemia, unspecified: Secondary | ICD-10-CM | POA: Diagnosis present

## 2019-12-22 DIAGNOSIS — Z7982 Long term (current) use of aspirin: Secondary | ICD-10-CM | POA: Diagnosis not present

## 2019-12-22 DIAGNOSIS — K5909 Other constipation: Secondary | ICD-10-CM | POA: Diagnosis present

## 2019-12-22 DIAGNOSIS — Z79899 Other long term (current) drug therapy: Secondary | ICD-10-CM | POA: Diagnosis not present

## 2019-12-22 LAB — COMPREHENSIVE METABOLIC PANEL
ALT: 20 U/L (ref 0–44)
AST: 26 U/L (ref 15–41)
Albumin: 3.2 g/dL — ABNORMAL LOW (ref 3.5–5.0)
Alkaline Phosphatase: 41 U/L (ref 38–126)
Anion gap: 9 (ref 5–15)
BUN: 11 mg/dL (ref 8–23)
CO2: 28 mmol/L (ref 22–32)
Calcium: 8.9 mg/dL (ref 8.9–10.3)
Chloride: 98 mmol/L (ref 98–111)
Creatinine, Ser: 0.69 mg/dL (ref 0.44–1.00)
GFR, Estimated: >60 mL/min (ref >60)
Glucose, Bld: 116 mg/dL — ABNORMAL HIGH (ref 70–99)
Potassium: 3 mmol/L — ABNORMAL LOW (ref 3.5–5.1)
Sodium: 135 mmol/L (ref 135–145)
Total Bilirubin: 0.6 mg/dL (ref 0.3–1.2)
Total Protein: 6.1 g/dL — ABNORMAL LOW (ref 6.5–8.1)

## 2019-12-22 LAB — CBC
HCT: 27.3 % — ABNORMAL LOW (ref 36.0–46.0)
Hemoglobin: 8.9 g/dL — ABNORMAL LOW (ref 12.0–15.0)
MCH: 32.5 pg (ref 26.0–34.0)
MCHC: 32.6 g/dL (ref 30.0–36.0)
MCV: 99.6 fL (ref 80.0–100.0)
Platelets: 272 10*3/uL (ref 150–400)
RBC: 2.74 MIL/uL — ABNORMAL LOW (ref 3.87–5.11)
RDW: 13 % (ref 11.5–15.5)
WBC: 6.8 10*3/uL (ref 4.0–10.5)
nRBC: 0 % (ref 0.0–0.2)

## 2019-12-22 LAB — MAGNESIUM: Magnesium: 1.9 mg/dL (ref 1.7–2.4)

## 2019-12-22 LAB — HEMOGLOBIN AND HEMATOCRIT, BLOOD
HCT: 31.3 % — ABNORMAL LOW (ref 36.0–46.0)
Hemoglobin: 10.2 g/dL — ABNORMAL LOW (ref 12.0–15.0)

## 2019-12-22 LAB — PHOSPHORUS: Phosphorus: 2.3 mg/dL — ABNORMAL LOW (ref 2.5–4.6)

## 2019-12-22 MED ORDER — MORPHINE SULFATE (PF) 2 MG/ML IV SOLN
2.0000 mg | INTRAVENOUS | Status: DC | PRN
  Administered 2019-12-22: 2 mg via INTRAVENOUS
  Filled 2019-12-22: qty 1

## 2019-12-22 MED ORDER — POTASSIUM CHLORIDE 20 MEQ PO PACK
40.0000 meq | PACK | Freq: Once | ORAL | Status: AC
  Administered 2019-12-22: 40 meq via ORAL
  Filled 2019-12-22: qty 2

## 2019-12-22 MED ORDER — POTASSIUM PHOSPHATES 15 MMOLE/5ML IV SOLN
30.0000 mmol | Freq: Once | INTRAVENOUS | Status: AC
  Administered 2019-12-22: 30 mmol via INTRAVENOUS
  Filled 2019-12-22: qty 10

## 2019-12-22 MED ORDER — METOPROLOL TARTRATE 5 MG/5ML IV SOLN
2.5000 mg | Freq: Once | INTRAVENOUS | Status: AC
  Administered 2019-12-22: 2.5 mg via INTRAVENOUS
  Filled 2019-12-22: qty 5

## 2019-12-22 MED ORDER — METOCLOPRAMIDE HCL 5 MG/ML IJ SOLN
10.0000 mg | Freq: Four times a day (QID) | INTRAMUSCULAR | Status: DC
  Administered 2019-12-22 – 2019-12-25 (×13): 10 mg via INTRAVENOUS
  Filled 2019-12-22 (×13): qty 2

## 2019-12-22 MED ORDER — HYDROMORPHONE HCL 1 MG/ML IJ SOLN
1.0000 mg | Freq: Once | INTRAMUSCULAR | Status: AC
  Administered 2019-12-22: 1 mg via INTRAVENOUS
  Filled 2019-12-22: qty 1

## 2019-12-22 MED ORDER — LACTATED RINGERS IV SOLN: INTRAVENOUS | Status: DC

## 2019-12-22 MED ORDER — IBUPROFEN 400 MG PO TABS: 400.0000 mg | ORAL_TABLET | ORAL | Status: DC | PRN

## 2019-12-22 NOTE — Anesthesia Postprocedure Evaluation (Signed)
Anesthesia Post Note  Patient: Raven Mclean  Procedure(s) Performed: LAPAROSCOPIC CHOLECYSTECTOMY (N/A Abdomen)     Patient location during evaluation: PACU Anesthesia Type: General Level of consciousness: awake and alert Pain management: pain level controlled Vital Signs Assessment: post-procedure vital signs reviewed and stable Respiratory status: spontaneous breathing, nonlabored ventilation, respiratory function stable and patient connected to nasal cannula oxygen Cardiovascular status: blood pressure returned to baseline and stable Postop Assessment: no apparent nausea or vomiting Anesthetic complications: no   No complications documented.  Last Vitals:  Vitals:   12/22/19 0523 12/22/19 0639  BP: (!) 172/88 (!) 172/85  Pulse: 99 83  Resp: 18   Temp: 36.8 C   SpO2: 97%     Last Pain:  Vitals:   12/22/19 0642  TempSrc:   PainSc: 6                  Marilyn Wing

## 2019-12-22 NOTE — Progress Notes (Signed)
PROGRESS NOTE    Raven Mclean  [id number] DOB: 05-13-1955 DOA: 12/19/2019 PCP: [facility])    Brief Narrative:  This 64 years old female with medical history significant for chronic back pain,  s/p total right knee replacement 2 weeks ago at [facility], hypertension,  hyperlipidemia presents in the ED with complaints of worsening right upper quadrant abdominal pain associated with nausea and vomiting.  She was found to have gallstones without evidence of acute cholecystitis. Patient is admitted for acute cholecystitis.  General surgery consulted, Patient underwent laparoscopic cholecystectomy,  tolerated well but continues to have nausea and vomiting.  Assessment & Plan:   Active Problems:   RUQ abdominal pain   Cholelithiasis  Right upper quadrant abdominal pain in the setting of cholelithiasis She had a CT abdomen and pelvis with contrast done on 12/17/2019 which showed cholelithiasis with mild gallbladder distention.  No specific evidence of acute cholecystitis or biliary duct dilatation. Right upper quadrant abdominal ultrasound done on 12/20/2019 showed cholelithiasis without sonographic evidence of acute cholecystitis. General surgery Dr. Erlinda Hong was consulted by EDP Patient underwent laparoscopic cholecystectomy, tolerated well. Continue IV fluids, antiemetics PRN Pain control PRN, add scheduled bowel regimen. Started clear liquid diet,  continued to remain nauseous, will advance as tolerated.  Refractory nausea and vomiting likely secondary to above Continue antiemetics as needed. Continue IV fluids.  Hypokalemia >> improved Serum potassium 3.1, goal 4.0 Continue  IV normal saline KCl 40 mEq 100 cc/h Repeat BMP  Essential hypertension BP stable. Resume home oral antihypertensives, Lopressor 12.5 mg daily Hold home HCTZ   Hyperlipidemia Resume home simvastatin  Chronic constipation, likely opiate induced Bowel regimen    DVT prophylaxis:  SCDs Code Status: Full code Family Communication: No one at bedside Disposition Plan:  Status is: Inpatient  Remains inpatient appropriate because:Inpatient level of care appropriate due to severity of illness   Dispo:  Patient From: Home  Planned Disposition: Home  Expected discharge date: 12/23/19  Medically stable for discharge: No    Consultants:   General surgery  Procedures: Laparoscopic cholecystectomy Antimicrobials:  Anti-infectives (From admission, onward)   Start     Dose/Rate Route Frequency Ordered Stop   12/20/19 0800  cefTRIAXone (ROCEPHIN) 2 g in sodium chloride 0.9 % 100 mL IVPB  Status:  Discontinued        2 g 200 mL/hr over 30 Minutes Intravenous Every 24 hours 12/20/19 0739 12/22/19 0093      Subjective: Patient was seen and examined at bedside.  Overnight events noted.  Patient is status post laparoscopic cholecystectomy. Post Op day 1  Patient continues to have nausea and vomiting. She couldn't tolerated clear liquid diet.  Objective: Vitals:   12/21/19 2100 12/22/19 0133 12/22/19 0523 12/22/19 0639  BP: (!) 151/81  (!) 172/88 (!) 172/85  Pulse: 91  99 83  Resp: 19  18   Temp: 98.8 F (37.1 C) 99.2 F (37.3 C) 98.2 F (36.8 C)   TempSrc: Oral  Oral   SpO2: 94%  97%   Weight:      Height:        Intake/Output Summary (Last 24 hours) at 12/22/2019 1306 Last data filed at 12/21/2019 1900 Gross per 24 hour  Intake 150 ml  Output 250 ml  Net -100 ml   Filed Weights   12/20/19 1332  Weight: 90.5 kg    Examination:  General exam: Appears calm and comfortable.  Not in acute distress Respiratory system: Clear to auscultation. Respiratory  effort normal. Cardiovascular system: S1 & S2 heard, RRR. No JVD, murmurs, rubs, gallops or clicks. No pedal edema. Gastrointestinal system: Abdomen is nondistended, soft and  Mildly tender. No organomegaly or masses felt.  Normal bowel sounds heard.  Laparoscopic surgical scars noted. Central  nervous system: Alert and oriented. No focal neurological deficits. Extremities: No clubbing,  no edema, no cyanosis. Skin: No rashes, lesions or ulcers Psychiatry: Judgement and insight appear normal. Mood & affect appropriate.     Data Reviewed: I have personally reviewed following labs and imaging studies  CBC: Recent Labs  Lab 12/17/19 1149 12/17/19 1149 12/19/19 2019 12/20/19 0453 12/21/19 0242 12/22/19 0309 12/22/19 1003  WBC 4.3  --  7.3 4.9 7.2 6.8  --   HGB 11.0*   < > 11.4* 10.3* 10.9* 8.9* 10.2*  HCT 34.9*   < > 35.6* 31.8* 34.3* 27.3* 31.3*  MCV 102.3*  --  101.1* 100.6* 100.6* 99.6  --   PLT 387  --  390 341 312 272  --    < > = values in this interval not displayed.   Basic Metabolic Panel: Recent Labs  Lab 12/17/19 1149 12/19/19 2019 12/20/19 0453 12/21/19 0242 12/22/19 0309  NA 140 135 135 136 135  K 3.6 3.1* 3.1* 3.5 3.0*  CL 101 97* 98 102 98  CO2 27 22 25 23 28   GLUCOSE 103* 125* 138* 119* 116*  BUN 10 10 9 12 11   CREATININE 0.80 0.88 0.74 0.80 0.69  CALCIUM 10.1 9.7 9.0 9.2 8.9  MG  --   --  1.7 1.9 1.9  PHOS  --   --   --  3.8 2.3*   GFR: Estimated Creatinine Clearance: 71.8 mL/min (by C-G formula based on SCr of 0.69 mg/dL). Liver Function Tests: Recent Labs  Lab 12/17/19 1149 12/19/19 2019 12/20/19 0453 12/21/19 0242 12/22/19 0309  AST 17 19 18  39 26  ALT 9 9 9 20 20   ALKPHOS 60 54 51 52 41  BILITOT 0.6 0.9 0.7 0.9 0.6  PROT 7.2 7.4 6.7 6.8 6.1*  ALBUMIN 3.9 4.0 3.7 3.5 3.2*   Recent Labs  Lab 12/17/19 1149 12/19/19 2019  LIPASE 37 34   No results for input(s): AMMONIA in the last 168 hours. Coagulation Profile: Recent Labs  Lab 12/20/19 0721  INR 1.1   Cardiac Enzymes: No results for input(s): CKTOTAL, CKMB, CKMBINDEX, TROPONINI in the last 168 hours. BNP (last 3 results) No results for input(s): PROBNP in the last 8760 hours. HbA1C: No results for input(s): HGBA1C in the last 72 hours. CBG: No results for  input(s): GLUCAP in the last 168 hours. Lipid Profile: No results for input(s): CHOL, HDL, LDLCALC, TRIG, CHOLHDL, LDLDIRECT in the last 72 hours. Thyroid Function Tests: No results for input(s): TSH, T4TOTAL, FREET4, T3FREE, THYROIDAB in the last 72 hours. Anemia Panel: No results for input(s): VITAMINB12, FOLATE, FERRITIN, TIBC, IRON, RETICCTPCT in the last 72 hours. Sepsis Labs: No results for input(s): PROCALCITON, LATICACIDVEN in the last 168 hours.  Recent Results (from the past 240 hour(s))  Respiratory Panel by RT PCR (Flu A&B, Covid) - Nasopharyngeal Swab     Status: None   Collection Time: 12/20/19  4:03 AM   Specimen: Nasopharyngeal Swab; Nasopharyngeal(NP) swabs in vial transport medium  Result Value Ref Range Status   SARS Coronavirus 2 by RT PCR NEGATIVE NEGATIVE Final    Comment: (NOTE) SARS-CoV-2 target nucleic acids are NOT DETECTED.  The SARS-CoV-2 RNA is generally detectable in  upper respiratoy specimens during the acute phase of infection. The lowest concentration of SARS-CoV-2 viral copies this assay can detect is 131 copies/mL. A negative result does not preclude SARS-Cov-2 infection and should not be used as the sole basis for treatment or other patient management decisions. A negative result may occur with  improper specimen collection/handling, submission of specimen other than nasopharyngeal swab, presence of viral mutation(s) within the areas targeted by this assay, and inadequate number of viral copies (<131 copies/mL). A negative result must be combined with clinical observations, patient history, and epidemiological information. The expected result is Negative.  Fact Sheet for Patients:  PinkCheek.be  Fact Sheet for Healthcare Providers:  GravelBags.it  This test is no t yet approved or cleared by the [place] FDA and  has been authorized for detection and/or diagnosis of SARS-CoV-2  by FDA under an Emergency Use Authorization (EUA). This EUA will remain  in effect (meaning this test can be used) for the duration of the COVID-19 declaration under Section 564(b)(1) of the Act, 21 U.S.C. section 360bbb-3(b)(1), unless the authorization is terminated or revoked sooner.     Influenza A by PCR NEGATIVE NEGATIVE Final   Influenza B by PCR NEGATIVE NEGATIVE Final    Comment: (NOTE) The Xpert Xpress SARS-CoV-2/FLU/RSV assay is intended as an aid in  the diagnosis of influenza from Nasopharyngeal swab specimens and  should not be used as a sole basis for treatment. Nasal washings and  aspirates are unacceptable for Xpert Xpress SARS-CoV-2/FLU/RSV  testing.  Fact Sheet for Patients: PinkCheek.be  Fact Sheet for Healthcare Providers: GravelBags.it  This test is not yet approved or cleared by the [place] FDA and  has been authorized for detection and/or diagnosis of SARS-CoV-2 by  FDA under an Emergency Use Authorization (EUA). This EUA will remain  in effect (meaning this test can be used) for the duration of the  Covid-19 declaration under Section 564(b)(1) of the Act, 21  U.S.C. section 360bbb-3(b)(1), unless the authorization is  terminated or revoked. Performed at [facility], [place] [address], [place], [place] [address]          Radiology Studies: No results found.      Scheduled Meds: . vitamin C with rose hips  500 mg Oral Daily  . cholecalciferol  1,000 Units Oral Daily  . enoxaparin (LOVENOX) injection  40 mg Subcutaneous Q24H  . hydrochlorothiazide  25 mg Oral Daily  . loratadine  10 mg Oral Daily  . metoCLOPramide (REGLAN) injection  10 mg Intravenous Q6H  . metoprolol tartrate  12.5 mg Oral Daily  . multivitamin with minerals  1 tablet Oral Daily  . pneumococcal 23 valent vaccine  0.5 mL Intramuscular Once  . potassium chloride  10 mEq Oral Daily  . senna-docusate  2  tablet Oral BID  . simvastatin  20 mg Oral Daily   Continuous Infusions: . lactated ringers 10 mL/hr at 12/20/19 1024  . lactated ringers 75 mL/hr at 12/22/19 1232  . magnesium sulfate bolus IVPB    . potassium PHOSPHATE IVPB (in mmol) 30 mmol (12/22/19 0845)     LOS: 1 day    Time spent: 25 mins    Dillard Pascal, MD [facility]   If 7PM-7AM, please contact night-coverage

## 2019-12-22 NOTE — Progress Notes (Signed)
Pt says she last had a bowel movement a week ago. MD paged and order for soap enema given and administered with small amount of stool passed. Pt also passed a lot of gas while sitting on bedside commode. Still c/o nausea with dry heaving at times but no actual vomiting,scheduled &prn antiemetics administered. Prn lopressor also administered this evening for high blood pressure.

## 2019-12-22 NOTE — Progress Notes (Signed)
2 Days Post-Op   Subjective/Chief Complaint: Pt with some con't nausea Tol min PO   Objective: Vital signs in last 24 hours: Temp:  [97.6 F (36.4 C)-99.2 F (37.3 C)] 98.2 F (36.8 C) (11/21 0523) Pulse Rate:  [83-99] 83 (11/21 0639) Resp:  [18-19] 18 (11/21 0523) BP: (151-174)/(74-88) 172/85 (11/21 0639) SpO2:  [94 %-97 %] 97 % (11/21 0523) Last BM Date: 12/19/19  Intake/Output from previous day: 11/20 0701 - 11/21 0700 In: 150 [P.O.:150] Out: 250 [Urine:250] Intake/Output this shift: No intake/output data recorded.  General appearance: alert and cooperative GI: soft, non-tender; bowel sounds normal; no masses,  no organomegaly and inc c /d/i  Lab Results:  Recent Labs    12/21/19 0242 12/22/19 0309  WBC 7.2 6.8  HGB 10.9* 8.9*  HCT 34.3* 27.3*  PLT 312 272   BMET Recent Labs    12/21/19 0242 12/22/19 0309  NA 136 135  K 3.5 3.0*  CL 102 98  CO2 23 28  GLUCOSE 119* 116*  BUN 12 11  CREATININE 0.80 0.69  CALCIUM 9.2 8.9   PT/INR Recent Labs    12/20/19 0721  LABPROT 13.6  INR 1.1   ABG No results for input(s): PHART, HCO3 in the last 72 hours.  Invalid input(s): PCO2, PO2  Studies/Results: No results found.  Anti-infectives: Anti-infectives (From admission, onward)   Start     Dose/Rate Route Frequency Ordered Stop   12/20/19 0800  cefTRIAXone (ROCEPHIN) 2 g in sodium chloride 0.9 % 100 mL IVPB  Status:  Discontinued        2 g 200 mL/hr over 30 Minutes Intravenous Every 24 hours 12/20/19 0739 12/22/19 3709      Assessment/Plan: s/p Procedure(s): LAPAROSCOPIC CHOLECYSTECTOMY (N/A) Advance diet  Mobilize Decrease narcotics Home tomorrow  LOS: 1 day    Ralene Ok 12/22/2019

## 2019-12-23 ENCOUNTER — Encounter (HOSPITAL_COMMUNITY): Payer: Self-pay | Admitting: General Surgery

## 2019-12-23 DIAGNOSIS — K8020 Calculus of gallbladder without cholecystitis without obstruction: Secondary | ICD-10-CM | POA: Diagnosis not present

## 2019-12-23 LAB — COMPREHENSIVE METABOLIC PANEL
ALT: 17 U/L (ref 0–44)
AST: 22 U/L (ref 15–41)
Albumin: 3.2 g/dL — ABNORMAL LOW (ref 3.5–5.0)
Alkaline Phosphatase: 52 U/L (ref 38–126)
Anion gap: 9 (ref 5–15)
BUN: 5 mg/dL — ABNORMAL LOW (ref 8–23)
CO2: 27 mmol/L (ref 22–32)
Calcium: 8.9 mg/dL (ref 8.9–10.3)
Chloride: 96 mmol/L — ABNORMAL LOW (ref 98–111)
Creatinine, Ser: 0.71 mg/dL (ref 0.44–1.00)
GFR, Estimated: >60 mL/min (ref >60)
Glucose, Bld: 107 mg/dL — ABNORMAL HIGH (ref 70–99)
Potassium: 3.3 mmol/L — ABNORMAL LOW (ref 3.5–5.1)
Sodium: 132 mmol/L — ABNORMAL LOW (ref 135–145)
Total Bilirubin: 0.9 mg/dL (ref 0.3–1.2)
Total Protein: 6 g/dL — ABNORMAL LOW (ref 6.5–8.1)

## 2019-12-23 LAB — CBC
HCT: 28.3 % — ABNORMAL LOW (ref 36.0–46.0)
Hemoglobin: 9.3 g/dL — ABNORMAL LOW (ref 12.0–15.0)
MCH: 32.1 pg (ref 26.0–34.0)
MCHC: 32.9 g/dL (ref 30.0–36.0)
MCV: 97.6 fL (ref 80.0–100.0)
Platelets: 292 10*3/uL (ref 150–400)
RBC: 2.9 MIL/uL — ABNORMAL LOW (ref 3.87–5.11)
RDW: 12.7 % (ref 11.5–15.5)
WBC: 8.1 10*3/uL (ref 4.0–10.5)
nRBC: 0 % (ref 0.0–0.2)

## 2019-12-23 LAB — SURGICAL PATHOLOGY

## 2019-12-23 LAB — PHOSPHORUS: Phosphorus: 3.1 mg/dL (ref 2.5–4.6)

## 2019-12-23 MED ORDER — METOPROLOL TARTRATE 5 MG/5ML IV SOLN
2.5000 mg | Freq: Four times a day (QID) | INTRAVENOUS | Status: DC | PRN
  Administered 2019-12-23 – 2019-12-25 (×5): 2.5 mg via INTRAVENOUS
  Filled 2019-12-23 (×5): qty 5

## 2019-12-23 MED ORDER — BOOST / RESOURCE BREEZE PO LIQD CUSTOM
1.0000 | Freq: Three times a day (TID) | ORAL | Status: DC
  Administered 2019-12-23 – 2019-12-25 (×4): 1 via ORAL

## 2019-12-23 MED ORDER — ACETAMINOPHEN 500 MG PO TABS
1000.0000 mg | ORAL_TABLET | Freq: Three times a day (TID) | ORAL | Status: DC
  Administered 2019-12-23 – 2019-12-25 (×7): 1000 mg via ORAL
  Filled 2019-12-23 (×7): qty 2

## 2019-12-23 MED ORDER — OXYCODONE HCL 5 MG PO TABS
5.0000 mg | ORAL_TABLET | ORAL | Status: DC | PRN
  Administered 2019-12-23 – 2019-12-25 (×10): 10 mg via ORAL
  Filled 2019-12-23 (×10): qty 2

## 2019-12-23 MED ORDER — POLYETHYLENE GLYCOL 3350 17 G PO PACK
17.0000 g | PACK | Freq: Every day | ORAL | Status: DC
  Administered 2019-12-23 – 2019-12-24 (×2): 17 g via ORAL
  Filled 2019-12-23 (×2): qty 1

## 2019-12-23 MED ORDER — IBUPROFEN 600 MG PO TABS
600.0000 mg | ORAL_TABLET | Freq: Three times a day (TID) | ORAL | Status: DC
  Administered 2019-12-23 – 2019-12-25 (×7): 600 mg via ORAL
  Filled 2019-12-23 (×6): qty 1

## 2019-12-23 NOTE — Progress Notes (Signed)
3 Days Post-Op    CC: Abdominal pain  Subjective: Still complaining of nausea and some vomiting.  Complains of pain.  She takes oxycodone at home 1 tablet every 4.  She has trouble with constipation, but had good results with an enema yesterday.  She says she has been up walking some.  Her port sites all look fine.  Objective: Vital signs in last 24 hours: Temp:  [97.2 F (36.2 C)-100 F (37.8 C)] 98.5 F (36.9 C) (11/22 0655) Pulse Rate:  [72-88] 72 (11/22 0655) Resp:  [14-20] 18 (11/22 0655) BP: (160-180)/(80-90) 166/83 (11/22 0655) SpO2:  [92 %-100 %] 100 % (11/22 0655) Last BM Date: 12/15/19 Nothing p.o. recorded yesterday 120 p.o. recorded this AM Urine x5 yesterday BM x2 yesterday T-max 100 BP 166/83 Sodium 132, potassium 3.3, chloride 96 glucose 107 FTs are normal WBC 8.1 H/H 9.3/28.3 Platelets 292,000  Intake/Output from previous day: No intake/output data recorded. Intake/Output this shift: Total I/O In: 120 [P.O.:120] Out: -   General appearance: alert, cooperative and no distress Resp: clear to auscultation bilaterally GI: Soft, sore, port sites all look fine.  Still complaining of pain.  Positive BM with laxative.  Lab Results:  Recent Labs    12/22/19 0309 12/22/19 0309 12/22/19 1003 12/23/19 0301  WBC 6.8  --   --  8.1  HGB 8.9*   < > 10.2* 9.3*  HCT 27.3*   < > 31.3* 28.3*  PLT 272  --   --  292   < > = values in this interval not displayed.    BMET Recent Labs    12/22/19 0309 12/23/19 0301  NA 135 132*  K 3.0* 3.3*  CL 98 96*  CO2 28 27  GLUCOSE 116* 107*  BUN 11 5*  CREATININE 0.69 0.71  CALCIUM 8.9 8.9   PT/INR No results for input(s): LABPROT, INR in the last 72 hours.  Recent Labs  Lab 12/19/19 2019 12/20/19 0453 12/21/19 0242 12/22/19 0309 12/23/19 0301  AST 19 18 39 26 22  ALT 9 9 20 20 17   ALKPHOS 54 51 52 41 52  BILITOT 0.9 0.7 0.9 0.6 0.9  PROT 7.4 6.7 6.8 6.1* 6.0*  ALBUMIN 4.0 3.7 3.5 3.2* 3.2*      Lipase     Component Value Date/Time   LIPASE 34 12/19/2019 2019     Prior to Admission medications   Medication Sig Start Date End Date Taking? Authorizing Provider  acetaminophen (TYLENOL) 500 MG tablet Take 1,000 mg by mouth every 8 (eight) hours as needed for mild pain, fever or headache.    Yes [provider]  diclofenac Sodium (VOLTAREN) 1 % GEL Apply 2 g topically 4 (four) times daily as needed (pain).    Yes [provider]  hydrochlorothiazide (HYDRODIURIL) 25 MG tablet Take 25 mg by mouth daily.   Yes [provider]  lidocaine (LIDODERM) 5 % Place 1-3 patches onto the skin daily. Remove & Discard patch within 12 hours or as directed by MD   Yes [provider]  metoprolol tartrate (LOPRESSOR) 50 MG tablet Take 50 mg by mouth daily.   Yes [provider]  Multiple Vitamins-Minerals (MULTIVITAMIN WITH MINERALS) tablet Take 1 tablet by mouth daily.   Yes [provider]  ondansetron (ZOFRAN ODT) 4 MG disintegrating tablet 4mg  ODT q4 hours prn nausea/vomit Patient taking differently: Take 4 mg by mouth every 4 (four) hours as needed for nausea or vomiting.  12/17/19  Yes  Jacqlyn Larsen, PA-C  oxyCODONE (OXY IR/ROXICODONE) 5 MG immediate release tablet Take 5 mg by mouth 4 (four) times daily as needed for pain. 12/16/19  Yes [provider]  alum & mag hydroxide-simeth (MAALOX MAX) 400-400-40 MG/5ML suspension Take 15 mLs by mouth every 6 (six) hours as needed for indigestion. 12/17/19   Jacqlyn Larsen, PA-C    Medications: . vitamin C with rose hips  500 mg Oral Daily  . cholecalciferol  1,000 Units Oral Daily  . enoxaparin (LOVENOX) injection  40 mg Subcutaneous Q24H  . hydrochlorothiazide  25 mg Oral Daily  . loratadine  10 mg Oral Daily  . metoCLOPramide (REGLAN) injection  10 mg Intravenous Q6H  . metoprolol tartrate  12.5 mg Oral Daily  . multivitamin with minerals  1 tablet Oral Daily  . pneumococcal 23  valent vaccine  0.5 mL Intramuscular Once  . potassium chloride  10 mEq Oral Daily  . senna-docusate  2 tablet Oral BID  . simvastatin  20 mg Oral Daily   . lactated ringers 10 mL/hr at 12/20/19 1024  . lactated ringers 75 mL/hr at 12/23/19 0956  . magnesium sulfate bolus IVPB      Assessment/Plan Hypokalemia Chronic back pain S/p total right knee replacement 2 weeks ago/[facility] Chronic constipation - oxycodone Hyperlipidemia Hypertension  Acute and chronic cholecystitis Laparoscopic cholecystectomy, 12/20/2019 Dr. Ralene Ok. Postop nausea and vomiting  FEN: IV fluids/full liquids ID: Rocephin 11/19-11/20/2021 DVT: Lovenox Follow-up: [facility] 01/09/2020@3 :45 PM  Plan: I will advance her to a soft diet later take what ever she wants.  Add MiraLAX for chronic constipation.  Tylenol 1 g every 6, Motrin 600 mg every 8 hours, resume oxycodone, replace potassium.  From our standpoint she can go when she can tolerate a diet and pain is controlled with oral medications.  Follow-up is in the AVS.    LOS: 1 day    Raven Mclean 12/23/2019 Please see Amion

## 2019-12-23 NOTE — Progress Notes (Signed)
Patient have been running  BP's 160's/80's. Stark Klein, NP aware. PRN Metoprolol given x2. Endorse to incoming shift.

## 2019-12-23 NOTE — Progress Notes (Signed)
PROGRESS NOTE    Raven Mclean  [id number] DOB: February 15, 1955 DOA: 12/19/2019 PCP: [facility], [facility] (In[facility])    Brief Narrative:  This 64 years old female with medical history significant for chronic back pain,  s/p total right knee replacement 2 weeks ago at [facility], hypertension,  hyperlipidemia presents in the ED with complaints of worsening right upper quadrant abdominal pain associated with nausea and vomiting.  She was found to have gallstones without evidence of acute cholecystitis. Patient is admitted for acute cholecystitis.  General surgery consulted, Patient underwent laparoscopic cholecystectomy,  tolerated well but continues to have nausea and vomiting.  She continued to have abdominal pain.  Could not full liquid diet.  Assessment & Plan:   Active Problems:   RUQ abdominal pain   Cholelithiasis  Right upper quadrant abdominal pain in the setting of cholelithiasis She had a CT abdomen and pelvis with contrast done on 12/17/2019 which showed cholelithiasis with mild gallbladder distention.  No specific evidence of acute cholecystitis or biliary duct dilatation. Right upper quadrant abdominal ultrasound done on 12/20/2019 showed cholelithiasis without sonographic evidence of acute cholecystitis. General surgery Dr. Erlinda Hong was consulted by EDP Patient underwent laparoscopic cholecystectomy, tolerated well. Continue IV fluids, antiemetics PRN Pain control PRN, add scheduled bowel regimen. Started clear liquid diet,  continued to remain nauseous, will advance as tolerated.   Refractory nausea and vomiting likely secondary to above Continue antiemetics as needed. Continue IV fluids.  Hypokalemia >> improved Serum potassium 3.1, goal 4.0 Continue  IV normal saline KCl 40 mEq 100 cc/h Repeat BMP  Essential hypertension BP stable. Resume home oral antihypertensives, Lopressor 12.5 mg daily Hold home HCTZ   Hyperlipidemia Resume home simvastatin  Chronic  constipation, likely opiate induced Bowel regimen    DVT prophylaxis: SCDs Code Status: Full code Family Communication: No one at bedside Disposition Plan:  Status is: Inpatient  Remains inpatient appropriate because:Inpatient level of care appropriate due to severity of illness   Dispo:  Patient From: Home  Planned Disposition: Home  Expected discharge date: 12/24/19  Medically stable for discharge: No    Consultants:   General surgery  Procedures: Laparoscopic cholecystectomy Antimicrobials:  Anti-infectives (From admission, onward)   Start     Dose/Rate Route Frequency Ordered Stop   12/20/19 0800  cefTRIAXone (ROCEPHIN) 2 g in sodium chloride 0.9 % 100 mL IVPB  Status:  Discontinued        2 g 200 mL/hr over 30 Minutes Intravenous Every 24 hours 12/20/19 0739 12/22/19 0808      Subjectiv Patient was seen and examined at bedside.  Overnight events noted.  Patient is status post laparoscopic cholecystectomy. Post Op day 2. Patient continues to have nausea and vomiting. She couldn't tolerated full liquid diet.  Objective: Vitals:   12/23/19 0402 12/23/19 0634 12/23/19 0655 12/23/19 1421  BP: (!) 168/90 (!) 168/88 (!) 166/83 (!) 156/88  Pulse: 79 82 72 78  Resp: 18 18 18 14   Temp: 98.6 F (37 C) (!) 97.2 F (36.2 C) 98.5 F (36.9 C) 97.7 F (36.5 C)  TempSrc: Oral Oral Oral   SpO2: 93%  100% 96%  Weight:      Height:        Intake/Output Summary (Last 24 hours) at 12/23/2019 1603 Last data filed at 12/23/2019 2426 Gross per 24 hour  Intake 120 ml  Output --  Net 120 ml   Filed Weights   12/20/19 1332  Weight: 90.5 kg    Examination:  General exam: Appears calm and comfortable.  Not in acute distress Respiratory system: Clear to auscultation. Respiratory effort normal. Cardiovascular system: S1 & S2 heard, RRR. No JVD, murmurs, rubs, gallops or clicks. No pedal edema. Gastrointestinal system: Abdomen is nondistended, soft and  Mildly tender.  No organomegaly or masses felt.  Normal bowel sounds heard.  Laparoscopic surgical scars noted. Central nervous system: Alert and oriented. No focal neurological deficits. Extremities: No clubbing,  no edema, no cyanosis. Skin: No rashes, lesions or ulcers Psychiatry: Judgement and insight appear normal. Mood & affect appropriate.     Data Reviewed: I have personally reviewed following labs and imaging studies  CBC: Recent Labs  Lab 12/19/19 2019 12/19/19 2019 12/20/19 0453 12/21/19 0242 12/22/19 0309 12/22/19 1003 12/23/19 0301  WBC 7.3  --  4.9 7.2 6.8  --  8.1  HGB 11.4*   < > 10.3* 10.9* 8.9* 10.2* 9.3*  HCT 35.6*   < > 31.8* 34.3* 27.3* 31.3* 28.3*  MCV 101.1*  --  100.6* 100.6* 99.6  --  97.6  PLT 390  --  341 312 272  --  292   < > = values in this interval not displayed.   Basic Metabolic Panel: Recent Labs  Lab 12/19/19 2019 12/20/19 0453 12/21/19 0242 12/22/19 0309 12/23/19 0301  NA 135 135 136 135 132*  K 3.1* 3.1* 3.5 3.0* 3.3*  CL 97* 98 102 98 96*  CO2 22 25 23 28 27   GLUCOSE 125* 138* 119* 116* 107*  BUN 10 9 12 11  5*  CREATININE 0.88 0.74 0.80 0.69 0.71  CALCIUM 9.7 9.0 9.2 8.9 8.9  MG  --  1.7 1.9 1.9  --   PHOS  --   --  3.8 2.3* 3.1   GFR: Estimated Creatinine Clearance: 71.8 mL/min (by C-G formula based on SCr of 0.71 mg/dL). Liver Function Tests: Recent Labs  Lab 12/19/19 2019 12/20/19 0453 12/21/19 0242 12/22/19 0309 12/23/19 0301  AST 19 18 39 26 22  ALT 9 9 20 20 17   ALKPHOS 54 51 52 41 52  BILITOT 0.9 0.7 0.9 0.6 0.9  PROT 7.4 6.7 6.8 6.1* 6.0*  ALBUMIN 4.0 3.7 3.5 3.2* 3.2*   Recent Labs  Lab 12/17/19 1149 12/19/19 2019  LIPASE 37 34   No results for input(s): AMMONIA in the last 168 hours. Coagulation Profile: Recent Labs  Lab 12/20/19 0721  INR 1.1   Cardiac Enzymes: No results for input(s): CKTOTAL, CKMB, CKMBINDEX, TROPONINI in the last 168 hours. BNP (last 3 results) No results for input(s): PROBNP in the  last 8760 hours. HbA1C: No results for input(s): HGBA1C in the last 72 hours. CBG: No results for input(s): GLUCAP in the last 168 hours. Lipid Profile: No results for input(s): CHOL, HDL, LDLCALC, TRIG, CHOLHDL, LDLDIRECT in the last 72 hours. Thyroid Function Tests: No results for input(s): TSH, T4TOTAL, FREET4, T3FREE, THYROIDAB in the last 72 hours. Anemia Panel: No results for input(s): VITAMINB12, FOLATE, FERRITIN, TIBC, IRON, RETICCTPCT in the last 72 hours. Sepsis Labs: No results for input(s): PROCALCITON, LATICACIDVEN in the last 168 hours.  Recent Results (from the past 240 hour(s))  Respiratory Panel by RT PCR (Flu A&B, Covid) - Nasopharyngeal Swab     Status: None   Collection Time: 12/20/19  4:03 AM   Specimen: Nasopharyngeal Swab; Nasopharyngeal(NP) swabs in vial transport medium  Result Value Ref Range Status   SARS Coronavirus 2 by RT PCR NEGATIVE NEGATIVE Final    Comment: (NOTE)  SARS-CoV-2 target nucleic acids are NOT DETECTED.  The SARS-CoV-2 RNA is generally detectable in upper respiratoy specimens during the acute phase of infection. The lowest concentration of SARS-CoV-2 viral copies this assay can detect is 131 copies/mL. A negative result does not preclude SARS-Cov-2 infection and should not be used as the sole basis for treatment or other patient management decisions. A negative result may occur with  improper specimen collection/handling, submission of specimen other than nasopharyngeal swab, presence of viral mutation(s) within the areas targeted by this assay, and inadequate number of viral copies (<131 copies/mL). A negative result must be combined with clinical observations, patient history, and epidemiological information. The expected result is Negative.  Fact Sheet for Patients:  PinkCheek.be  Fact Sheet for Healthcare Providers:  GravelBags.it  This test is no t yet approved or  cleared by the [place] FDA and  has been authorized for detection and/or diagnosis of SARS-CoV-2 by FDA under an Emergency Use Authorization (EUA). This EUA will remain  in effect (meaning this test can be used) for the duration of the COVID-19 declaration under Section 564(b)(1) of the Act, 21 U.S.C. section 360bbb-3(b)(1), unless the authorization is terminated or revoked sooner.     Influenza A by PCR NEGATIVE NEGATIVE Final   Influenza B by PCR NEGATIVE NEGATIVE Final    Comment: (NOTE) The Xpert Xpress SARS-CoV-2/FLU/RSV assay is intended as an aid in  the diagnosis of influenza from Nasopharyngeal swab specimens and  should not be used as a sole basis for treatment. Nasal washings and  aspirates are unacceptable for Xpert Xpress SARS-CoV-2/FLU/RSV  testing.  Fact Sheet for Patients: PinkCheek.be  Fact Sheet for Healthcare Providers: GravelBags.it  This test is not yet approved or cleared by the [place] FDA and  has been authorized for detection and/or diagnosis of SARS-CoV-2 by  FDA under an Emergency Use Authorization (EUA). This EUA will remain  in effect (meaning this test can be used) for the duration of the  Covid-19 declaration under Section 564(b)(1) of the Act, 21  U.S.C. section 360bbb-3(b)(1), unless the authorization is  terminated or revoked. Performed at [facility], [place] [address] [place], [place] [address]          Radiology Studies: No results found.      Scheduled Meds: . acetaminophen  1,000 mg Oral Q8H  . vitamin C with rose hips  500 mg Oral Daily  . cholecalciferol  1,000 Units Oral Daily  . enoxaparin (LOVENOX) injection  40 mg Subcutaneous Q24H  . hydrochlorothiazide  25 mg Oral Daily  . ibuprofen  600 mg Oral TID  . loratadine  10 mg Oral Daily  . metoCLOPramide (REGLAN) injection  10 mg Intravenous Q6H  . metoprolol tartrate  12.5 mg Oral Daily  .  multivitamin with minerals  1 tablet Oral Daily  . pneumococcal 23 valent vaccine  0.5 mL Intramuscular Once  . polyethylene glycol  17 g Oral Daily  . potassium chloride  10 mEq Oral Daily  . senna-docusate  2 tablet Oral BID  . simvastatin  20 mg Oral Daily   Continuous Infusions: . lactated ringers 10 mL/hr at 12/20/19 1024  . lactated ringers 75 mL/hr at 12/23/19 0956  . magnesium sulfate bolus IVPB       LOS: 1 day    Time spent: 25 mins    Shawna Clamp, MD [facility]   If 7PM-7AM, please contact night-coverage

## 2019-12-23 NOTE — Progress Notes (Signed)
   12/23/19 0402  Vitals  Temp 98.6 F (37 C)  Temp Source Oral  BP (!) 168/90  BP Location Right Arm  BP Method Manual  Patient Position (if appropriate) Lying  Pulse Rate 79  Pulse Rate Source Dinamap  Resp 18  Level of Consciousness  Level of Consciousness Alert  MEWS COLOR  MEWS Score Color Green  Oxygen Therapy  SpO2 93 %  O2 Device Room Air  MEWS Score  MEWS Temp 0  MEWS Systolic 0  MEWS Pulse 0  MEWS RR 0  MEWS LOC 0  MEWS Score 0  Provider Notification  Provider Name/Title Sherrilyn Rist  Date Provider Notified 12/23/19  Time Provider Notified [phone]  Notification Type Page  Notification Reason Other (Comment) (High BP)  Response No new orders  Date of Provider Response 12/23/19  Time of Provider Response [id number]

## 2019-12-23 NOTE — Progress Notes (Signed)
Initial Nutrition Assessment  DOCUMENTATION CODES:   Obesity unspecified  INTERVENTION:   -Boost Breeze po TID, each supplement provides 250 kcal and 9 grams of protein -MVI with minerals daily  NUTRITION DIAGNOSIS:   Inadequate oral intake related to altered GI function, vomiting, nausea as evidenced by meal completion < 50%.  GOAL:   Patient will meet greater than or equal to 90% of their needs  MONITOR:   PO intake, Supplement acceptance, Diet advancement, Labs, Weight trends, Skin, I & O's  REASON FOR ASSESSMENT:   Malnutrition Screening Tool    ASSESSMENT:   Raven Mclean is a 64 y.o. female with medical history significant for chronic back pain, status post total right knee replacement 2 weeks ago at [facility], essential hypertension, hyperlipidemia who presented to [facility] ED with complaints of worsening severe right upper quadrant abdominal pain.  Pt admitted with rt upper quadrant pain in the setting of cholelithiasis.   11/19- s/p lap chole  Reviewed I/O's: +120 ml x 24 hours and +2 L since admission  Pt resting quietly at time of visit and did not arouse to voice.   Observed meal tray- pt consumed about 50% of meal. Noted documented meal intake 10-50%. Per MD notes, pt still complaining of nausea and vomiting today.  Reviewed wt hx; noted pt with distant wt loss. Unable to determine more acute weight loss at this time.   Medications reviewed and include vitamin C, vitamin D 3, reglan, miralax, and lactated ringers infusion @ 75 ml/hr.   Labs reviewed: Na: 132, K: 3.3 (on PO supplementation).   NUTRITION - FOCUSED PHYSICAL EXAM:    Most Recent Value  Orbital Region No depletion  Upper Arm Region No depletion  Thoracic and Lumbar Region No depletion  Buccal Region No depletion  Temple Region No depletion  Clavicle Bone Region No depletion  Clavicle and Acromion Bone Region No depletion  Scapular Bone Region No depletion  Dorsal Hand No depletion  Patellar  Region No depletion  Anterior Thigh Region No depletion  Posterior Calf Region No depletion  Hair Reviewed  Eyes Reviewed  Mouth Reviewed  Skin Reviewed  Nails Reviewed       Diet Order:   Diet Order            DIET SOFT Room service appropriate? Yes; Fluid consistency: Thin  Diet effective now                 EDUCATION NEEDS:   Education needs have been addressed  Skin:  Skin Assessment: Skin Integrity Issues: Skin Integrity Issues:: Incisions Incisions: closed abdomen  Last BM:  12/22/19  Height:   Ht Readings from Last 1 Encounters:  12/20/19 5\' 1"  (1.549 m)    Weight:   Wt Readings from Last 1 Encounters:  12/20/19 90.5 kg   BMI:  Body mass index is 37.7 kg/m.  Estimated Nutritional Needs:   Kcal:  1700-1900  Protein:  95-110 grams  Fluid:  > 1.7 L    Loistine Chance, RD, LDN, [place] Registered Dietitian II Certified Diabetes Care and Education Specialist Please refer to [facility] for RD and/or RD on-call/weekend/after hours pager

## 2019-12-24 DIAGNOSIS — K8020 Calculus of gallbladder without cholecystitis without obstruction: Secondary | ICD-10-CM | POA: Diagnosis not present

## 2019-12-24 LAB — COMPREHENSIVE METABOLIC PANEL
ALT: 14 U/L (ref 0–44)
AST: 18 U/L (ref 15–41)
Albumin: 3.2 g/dL — ABNORMAL LOW (ref 3.5–5.0)
Alkaline Phosphatase: 54 U/L (ref 38–126)
Anion gap: 11 (ref 5–15)
BUN: <5 mg/dL — ABNORMAL LOW (ref 8–23)
CO2: 28 mmol/L (ref 22–32)
Calcium: 9 mg/dL (ref 8.9–10.3)
Chloride: 90 mmol/L — ABNORMAL LOW (ref 98–111)
Creatinine, Ser: 0.61 mg/dL (ref 0.44–1.00)
GFR, Estimated: >60 mL/min (ref >60)
Glucose, Bld: 111 mg/dL — ABNORMAL HIGH (ref 70–99)
Potassium: 3 mmol/L — ABNORMAL LOW (ref 3.5–5.1)
Sodium: 129 mmol/L — ABNORMAL LOW (ref 135–145)
Total Bilirubin: 0.8 mg/dL (ref 0.3–1.2)
Total Protein: 6.3 g/dL — ABNORMAL LOW (ref 6.5–8.1)

## 2019-12-24 LAB — CBC
HCT: 29.8 % — ABNORMAL LOW (ref 36.0–46.0)
Hemoglobin: 9.8 g/dL — ABNORMAL LOW (ref 12.0–15.0)
MCH: 31.9 pg (ref 26.0–34.0)
MCHC: 32.9 g/dL (ref 30.0–36.0)
MCV: 97.1 fL (ref 80.0–100.0)
Platelets: 280 10*3/uL (ref 150–400)
RBC: 3.07 MIL/uL — ABNORMAL LOW (ref 3.87–5.11)
RDW: 12.6 % (ref 11.5–15.5)
WBC: 5.6 10*3/uL (ref 4.0–10.5)
nRBC: 0 % (ref 0.0–0.2)

## 2019-12-24 LAB — MAGNESIUM: Magnesium: 1.8 mg/dL (ref 1.7–2.4)

## 2019-12-24 LAB — PHOSPHORUS: Phosphorus: 3.4 mg/dL (ref 2.5–4.6)

## 2019-12-24 MED ORDER — POTASSIUM CHLORIDE 20 MEQ PO PACK
40.0000 meq | PACK | Freq: Once | ORAL | Status: AC
  Administered 2019-12-24: 40 meq via ORAL
  Filled 2019-12-24: qty 2

## 2019-12-24 MED ORDER — SODIUM CHLORIDE 0.9 % IV SOLN: INTRAVENOUS | Status: DC

## 2019-12-24 MED ORDER — POLYETHYLENE GLYCOL 3350 17 G PO PACK
17.0000 g | PACK | Freq: Two times a day (BID) | ORAL | Status: DC
  Administered 2019-12-24 – 2019-12-25 (×2): 17 g via ORAL
  Filled 2019-12-24 (×2): qty 1

## 2019-12-24 MED ORDER — POTASSIUM CHLORIDE 20 MEQ PO PACK
40.0000 meq | PACK | Freq: Once | ORAL | Status: DC
  Filled 2019-12-24: qty 2

## 2019-12-24 NOTE — Progress Notes (Signed)
Pt tolerated 750 tap water enema without difficulty.

## 2019-12-24 NOTE — Progress Notes (Addendum)
PROGRESS NOTE    Raven Mclean  [id number] DOB: 1955/05/03 DOA: 12/19/2019 PCP: P[facility], [facility] (Inactive)    Brief Narrative:  This 64 years old female with medical history significant for chronic back pain,  s/p total right knee replacement 2 weeks ago at [facility], hypertension,  hyperlipidemia presents in the ED with complaints of worsening right upper quadrant abdominal pain associated with nausea and vomiting.  She was found to have gallstones without evidence of acute cholecystitis. Patient is admitted for acute cholecystitis.  General surgery consulted, Patient underwent laparoscopic cholecystectomy,  tolerated well but continues to have nausea and vomiting.  She continued to have abdominal pain.  Could not tolerate full liquid diet.  Assessment & Plan:   Active Problems:   RUQ abdominal pain   Cholelithiasis  Right upper quadrant abdominal pain in the setting of cholelithiasis: She had a CT abdomen and pelvis with contrast done on 12/17/2019 which showed cholelithiasis with mild gallbladder distention.   No specific evidence of acute cholecystitis or biliary duct dilatation. Right upper quadrant abdominal ultrasound done on 12/20/2019 showed cholelithiasis without sonographic evidence of acute cholecystitis. General surgery Dr. Erlinda Hong was consulted by EDP. Patient underwent laparoscopic cholecystectomy, tolerated well. Continue IV fluids, antiemetics PRN Pain control PRN, added scheduled bowel regimen. Advanced to full liquid diet,  continued to remain nauseous, will advance as tolerated. Change Zofran to Reglan.   Refractory nausea and vomiting likely secondary to above Continue antiemetics as needed. Continue IV fluids.  Hypokalemia >> improved Serum potassium 3.1, goal 4.0 Continue  IV normal saline KCl 40 mEq 100 cc/h Repeat BMP  Essential hypertension BP stable. Resume home oral antihypertensives, Lopressor 12.5 mg daily Hold home  HCTZ   Hyperlipidemia Resume home simvastatin  Chronic constipation, likely opiate induced Bowel regimen    DVT prophylaxis: SCDs Code Status: Full code Family Communication: No one at bedside Disposition Plan:  Status is: Inpatient  Remains inpatient appropriate because:Inpatient level of care appropriate due to severity of illness   Dispo:  Patient From: Home  Planned Disposition: Home  Expected discharge date: 12/25/19  Medically stable for discharge: No    Consultants:   General surgery  Procedures: Laparoscopic cholecystectomy Antimicrobials:  Anti-infectives (From admission, onward)   Start     Dose/Rate Route Frequency Ordered Stop   12/20/19 0800  cefTRIAXone (ROCEPHIN) 2 g in sodium chloride 0.9 % 100 mL IVPB  Status:  Discontinued        2 g 200 mL/hr over 30 Minutes Intravenous Every 24 hours 12/20/19 0739 12/22/19 0808      Subjectiv Patient was seen and examined at bedside.  Overnight events noted.  Patient is status post laparoscopic cholecystectomy. Post Op day 3. Patient continues to have nausea and vomiting. She couldn't tolerated full liquid diet.  Objective: Vitals:   12/23/19 1421 12/23/19 2024 12/24/19 0417 12/24/19 1404  BP: (!) 156/88 (!) 178/84 (!) 162/86 (!) 167/93  Pulse: 78 82 83 80  Resp: 14 18 18 16   Temp: 97.7 F (36.5 C) 99.2 F (37.3 C) 99.2 F (37.3 C) 98.6 F (37 C)  TempSrc:  Oral Oral   SpO2: 96% 97% 94% 97%  Weight:      Height:        Intake/Output Summary (Last 24 hours) at 12/24/2019 1628 Last data filed at 12/24/2019 1000 Gross per 24 hour  Intake 925 ml  Output --  Net 925 ml   Filed Weights   12/20/19 1332  Weight: 90.5  kg    Examination:  General exam: Appears calm and comfortable.  Not in acute distress Respiratory system: Clear to auscultation. Respiratory effort normal. Cardiovascular system: S1 & S2 heard, RRR. No JVD, murmurs, rubs, gallops or clicks. No pedal edema. Gastrointestinal  system: Abdomen is nondistended, soft and  Mildly tender. No organomegaly or masses felt.  Normal bowel sounds heard.  Laparoscopic surgical scars noted. Central nervous system: Alert and oriented. No focal neurological deficits. Extremities: No clubbing,  no edema, no cyanosis. Skin: No rashes, lesions or ulcers Psychiatry: Judgement and insight appear normal. Mood & affect appropriate.     Data Reviewed: I have personally reviewed following labs and imaging studies  CBC: Recent Labs  Lab 12/20/19 0453 12/20/19 0453 12/21/19 0242 12/22/19 0309 12/22/19 1003 12/23/19 0301 12/24/19 0144  WBC 4.9  --  7.2 6.8  --  8.1 5.6  HGB 10.3*   < > 10.9* 8.9* 10.2* 9.3* 9.8*  HCT 31.8*   < > 34.3* 27.3* 31.3* 28.3* 29.8*  MCV 100.6*  --  100.6* 99.6  --  97.6 97.1  PLT 341  --  312 272  --  292 280   < > = values in this interval not displayed.   Basic Metabolic Panel: Recent Labs  Lab 12/20/19 0453 12/21/19 0242 12/22/19 0309 12/23/19 0301 12/24/19 0144  NA 135 136 135 132* 129*  K 3.1* 3.5 3.0* 3.3* 3.0*  CL 98 102 98 96* 90*  CO2 25 23 28 27 28   GLUCOSE 138* 119* 116* 107* 111*  BUN 9 12 11  5* <5*  CREATININE 0.74 0.80 0.69 0.71 0.61  CALCIUM 9.0 9.2 8.9 8.9 9.0  MG 1.7 1.9 1.9  --  1.8  PHOS  --  3.8 2.3* 3.1 3.4   GFR: Estimated Creatinine Clearance: 71.8 mL/min (by C-G formula based on SCr of 0.61 mg/dL). Liver Function Tests: Recent Labs  Lab 12/20/19 0453 12/21/19 0242 12/22/19 0309 12/23/19 0301 12/24/19 0144  AST 18 39 26 22 18   ALT 9 20 20 17 14   ALKPHOS 51 52 41 52 54  BILITOT 0.7 0.9 0.6 0.9 0.8  PROT 6.7 6.8 6.1* 6.0* 6.3*  ALBUMIN 3.7 3.5 3.2* 3.2* 3.2*   Recent Labs  Lab 12/19/19 2019  LIPASE 34   No results for input(s): AMMONIA in the last 168 hours. Coagulation Profile: Recent Labs  Lab 12/20/19 0721  INR 1.1   Cardiac Enzymes: No results for input(s): CKTOTAL, CKMB, CKMBINDEX, TROPONINI in the last 168 hours. BNP (last 3  results) No results for input(s): PROBNP in the last 8760 hours. HbA1C: No results for input(s): HGBA1C in the last 72 hours. CBG: No results for input(s): GLUCAP in the last 168 hours. Lipid Profile: No results for input(s): CHOL, HDL, LDLCALC, TRIG, CHOLHDL, LDLDIRECT in the last 72 hours. Thyroid Function Tests: No results for input(s): TSH, T4TOTAL, FREET4, T3FREE, THYROIDAB in the last 72 hours. Anemia Panel: No results for input(s): VITAMINB12, FOLATE, FERRITIN, TIBC, IRON, RETICCTPCT in the last 72 hours. Sepsis Labs: No results for input(s): PROCALCITON, LATICACIDVEN in the last 168 hours.  Recent Results (from the past 240 hour(s))  Respiratory Panel by RT PCR (Flu A&B, Covid) - Nasopharyngeal Swab     Status: None   Collection Time: 12/20/19  4:03 AM   Specimen: Nasopharyngeal Swab; Nasopharyngeal(NP) swabs in vial transport medium  Result Value Ref Range Status   SARS Coronavirus 2 by RT PCR NEGATIVE NEGATIVE Final    Comment: (NOTE) SARS-CoV-2  target nucleic acids are NOT DETECTED.  The SARS-CoV-2 RNA is generally detectable in upper respiratoy specimens during the acute phase of infection. The lowest concentration of SARS-CoV-2 viral copies this assay can detect is 131 copies/mL. A negative result does not preclude SARS-Cov-2 infection and should not be used as the sole basis for treatment or other patient management decisions. A negative result may occur with  improper specimen collection/handling, submission of specimen other than nasopharyngeal swab, presence of viral mutation(s) within the areas targeted by this assay, and inadequate number of viral copies (<131 copies/mL). A negative result must be combined with clinical observations, patient history, and epidemiological information. The expected result is Negative.  Fact Sheet for Patients:  PinkCheek.be  Fact Sheet for Healthcare Providers:   GravelBags.it  This test is no t yet approved or cleared by the [place] FDA and  has been authorized for detection and/or diagnosis of SARS-CoV-2 by FDA under an Emergency Use Authorization (EUA). This EUA will remain  in effect (meaning this test can be used) for the duration of the COVID-19 declaration under Section 564(b)(1) of the Act, 21 U.S.C. section 360bbb-3(b)(1), unless the authorization is terminated or revoked sooner.     Influenza A by PCR NEGATIVE NEGATIVE Final   Influenza B by PCR NEGATIVE NEGATIVE Final    Comment: (NOTE) The Xpert Xpress SARS-CoV-2/FLU/RSV assay is intended as an aid in  the diagnosis of influenza from Nasopharyngeal swab specimens and  should not be used as a sole basis for treatment. Nasal washings and  aspirates are unacceptable for Xpert Xpress SARS-CoV-2/FLU/RSV  testing.  Fact Sheet for Patients: PinkCheek.be  Fact Sheet for Healthcare Providers: GravelBags.it  This test is not yet approved or cleared by the [place] FDA and  has been authorized for detection and/or diagnosis of SARS-CoV-2 by  FDA under an Emergency Use Authorization (EUA). This EUA will remain  in effect (meaning this test can be used) for the duration of the  Covid-19 declaration under Section 564(b)(1) of the Act, 21  U.S.C. section 360bbb-3(b)(1), unless the authorization is  terminated or revoked. Performed at [facility], [place] [address] [place], [place] [address]          Radiology Studies: No results found.      Scheduled Meds: . acetaminophen  1,000 mg Oral Q8H  . vitamin C with rose hips  500 mg Oral Daily  . cholecalciferol  1,000 Units Oral Daily  . enoxaparin (LOVENOX) injection  40 mg Subcutaneous Q24H  . feeding supplement  1 Container Oral TID BM  . hydrochlorothiazide  25 mg Oral Daily  . ibuprofen  600 mg Oral TID  . loratadine  10  mg Oral Daily  . metoCLOPramide (REGLAN) injection  10 mg Intravenous Q6H  . metoprolol tartrate  12.5 mg Oral Daily  . multivitamin with minerals  1 tablet Oral Daily  . pneumococcal 23 valent vaccine  0.5 mL Intramuscular Once  . polyethylene glycol  17 g Oral BID  . potassium chloride  10 mEq Oral Daily  . potassium chloride  40 mEq Oral Once  . senna-docusate  2 tablet Oral BID  . simvastatin  20 mg Oral Daily   Continuous Infusions: . sodium chloride 125 mL/hr at 12/24/19 1617  . lactated ringers 10 mL/hr at 12/20/19 1024  . lactated ringers 75 mL/hr at 12/23/19 2359  . magnesium sulfate bolus IVPB       LOS: 2 days    Time spent:  25 mins    Shawna Clamp, MD [facility]   If 7PM-7AM, please contact night-coverage

## 2019-12-24 NOTE — Progress Notes (Signed)
4 Days Post-Op    CC: Abdominal pain  Subjective: Continues have multiple complaints, no BM yesterday, she did not walk yesterday secondary to weakness.  She has refused her potassium yesterday p.m.  Her port sites all look good.  Objective: Vital signs in last 24 hours: Temp:  [97.7 F (36.5 C)-99.2 F (37.3 C)] 99.2 F (37.3 C) (11/23 0417) Pulse Rate:  [78-83] 83 (11/23 0417) Resp:  [14-18] 18 (11/23 0417) BP: (156-178)/(84-88) 162/86 (11/23 0417) SpO2:  [94 %-97 %] 94 % (11/23 0417) Last BM Date: 12/23/19 240 p.o. recorded 525 IV recorded Urine x3 No BM recorded T-max 99.2 Vital signs are stable WBC 5.6 H/H 9.8/29.8 Platelets 280,000 Sodium 129 Potassium 3.0 Glucose 111 LFTs are normal. Intake/Output from previous day: 11/22 0701 - 11/23 0700 In: 765 [P.O.:240; I.V.:525] Out: -  Intake/Output this shift: No intake/output data recorded.  General appearance: alert, cooperative and no distress Resp: clear to auscultation bilaterally GI: Soft, sore, port sites all healing nicely.  Lab Results:  Recent Labs    12/23/19 0301 12/24/19 0144  WBC 8.1 5.6  HGB 9.3* 9.8*  HCT 28.3* 29.8*  PLT 292 280    BMET Recent Labs    12/23/19 0301 12/24/19 0144  NA 132* 129*  K 3.3* 3.0*  CL 96* 90*  CO2 27 28  GLUCOSE 107* 111*  BUN 5* <5*  CREATININE 0.71 0.61  CALCIUM 8.9 9.0   PT/INR No results for input(s): LABPROT, INR in the last 72 hours.  Recent Labs  Lab 12/20/19 0453 12/21/19 0242 12/22/19 0309 12/23/19 0301 12/24/19 0144  AST 18 39 26 22 18   ALT 9 20 20 17 14   ALKPHOS 51 52 41 52 54  BILITOT 0.7 0.9 0.6 0.9 0.8  PROT 6.7 6.8 6.1* 6.0* 6.3*  ALBUMIN 3.7 3.5 3.2* 3.2* 3.2*     Lipase     Component Value Date/Time   LIPASE 34 12/19/2019 2019     Medications: . acetaminophen  1,000 mg Oral Q8H  . vitamin C with rose hips  500 mg Oral Daily  . cholecalciferol  1,000 Units Oral Daily  . enoxaparin (LOVENOX) injection  40 mg  Subcutaneous Q24H  . feeding supplement  1 Container Oral TID BM  . hydrochlorothiazide  25 mg Oral Daily  . ibuprofen  600 mg Oral TID  . loratadine  10 mg Oral Daily  . metoCLOPramide (REGLAN) injection  10 mg Intravenous Q6H  . metoprolol tartrate  12.5 mg Oral Daily  . multivitamin with minerals  1 tablet Oral Daily  . pneumococcal 23 valent vaccine  0.5 mL Intramuscular Once  . polyethylene glycol  17 g Oral Daily  . potassium chloride  10 mEq Oral Daily  . potassium chloride  40 mEq Oral Once  . potassium chloride  40 mEq Oral Once  . senna-docusate  2 tablet Oral BID  . simvastatin  20 mg Oral Daily    Assessment/Plan Hypokalemia -Potassium 3.0/magnesium 1.8 Hyponatremia  -Sodium 129 Chronic back pain S/p total right knee replacement 2 weeks ago/[facility] Chronic constipation - oxycodone Hyperlipidemia Hypertension  Acute and chronic cholecystitis Laparoscopic cholecystectomy, 12/20/2019 Dr. Ralene Ok. Postop nausea and vomiting  FEN: IV fluids/full liquids ID: Rocephin 11/19-11/20/2021 DVT: Lovenox Follow-up: [facility] 01/09/2020@3 :45 PM Pain/nausea: Tylenol 1 g x 2; ibuprofen 600 mg x 3; Reglan 10 mg x 3; Zofran 4 mg x 2; oxycodone 10 mg x 3; Phenergan 6.25 mg x 1; tramadol 50 mg x 2 Constipation:  Senokot 2 tablets twice daily; MiraLAX 17 g x 1  Plan: No further surgical recommendations; I will increase her MiraLAX to twice daily, Dr. Dwyane Dee is trying to correct her electrolytes.   LOS: 2 days    Raven Mclean 12/24/2019 Please see Amion

## 2019-12-24 NOTE — Progress Notes (Signed)
Patient refused Potassium packet with water. Stated she's nauseous and has an aweful taste. Stark Klein, NP paged. awating for new orders.

## 2019-12-24 NOTE — Progress Notes (Signed)
Pt had some pieces of BM and passed some gas after tap water enema, states she feels better.

## 2019-12-25 DIAGNOSIS — K8020 Calculus of gallbladder without cholecystitis without obstruction: Secondary | ICD-10-CM | POA: Diagnosis not present

## 2019-12-25 LAB — COMPREHENSIVE METABOLIC PANEL
ALT: 11 U/L (ref 0–44)
AST: 16 U/L (ref 15–41)
Albumin: 3.3 g/dL — ABNORMAL LOW (ref 3.5–5.0)
Alkaline Phosphatase: 58 U/L (ref 38–126)
Anion gap: 11 (ref 5–15)
BUN: <5 mg/dL — ABNORMAL LOW (ref 8–23)
CO2: 26 mmol/L (ref 22–32)
Calcium: 9 mg/dL (ref 8.9–10.3)
Chloride: 93 mmol/L — ABNORMAL LOW (ref 98–111)
Creatinine, Ser: 0.7 mg/dL (ref 0.44–1.00)
GFR, Estimated: >60 mL/min (ref >60)
Glucose, Bld: 106 mg/dL — ABNORMAL HIGH (ref 70–99)
Potassium: 3 mmol/L — ABNORMAL LOW (ref 3.5–5.1)
Sodium: 130 mmol/L — ABNORMAL LOW (ref 135–145)
Total Bilirubin: 0.8 mg/dL (ref 0.3–1.2)
Total Protein: 5.9 g/dL — ABNORMAL LOW (ref 6.5–8.1)

## 2019-12-25 LAB — CBC
HCT: 31.8 % — ABNORMAL LOW (ref 36.0–46.0)
Hemoglobin: 10.4 g/dL — ABNORMAL LOW (ref 12.0–15.0)
MCH: 31.9 pg (ref 26.0–34.0)
MCHC: 32.7 g/dL (ref 30.0–36.0)
MCV: 97.5 fL (ref 80.0–100.0)
Platelets: 304 10*3/uL (ref 150–400)
RBC: 3.26 MIL/uL — ABNORMAL LOW (ref 3.87–5.11)
RDW: 13 % (ref 11.5–15.5)
WBC: 6.9 10*3/uL (ref 4.0–10.5)
nRBC: 0 % (ref 0.0–0.2)

## 2019-12-25 LAB — MAGNESIUM: Magnesium: 1.8 mg/dL (ref 1.7–2.4)

## 2019-12-25 LAB — PHOSPHORUS: Phosphorus: 3.3 mg/dL (ref 2.5–4.6)

## 2019-12-25 MED ORDER — METOPROLOL TARTRATE 25 MG PO TABS
12.5000 mg | ORAL_TABLET | Freq: Every day | ORAL | 1 refills | Status: DC
Start: 2019-12-26 — End: 2023-06-30

## 2019-12-25 MED ORDER — PANTOPRAZOLE SODIUM 40 MG PO TBEC
40.0000 mg | DELAYED_RELEASE_TABLET | Freq: Every day | ORAL | Status: DC
  Administered 2019-12-25: 40 mg via ORAL
  Filled 2019-12-25: qty 1

## 2019-12-25 MED ORDER — SODIUM CHLORIDE 0.9 % IV SOLN: INTRAVENOUS | Status: DC

## 2019-12-25 MED ORDER — MAGNESIUM SULFATE IN D5W 1-5 GM/100ML-% IV SOLN
1.0000 g | Freq: Once | INTRAVENOUS | Status: DC
  Filled 2019-12-25: qty 100

## 2019-12-25 MED ORDER — FLEET ENEMA 7-19 GM/118ML RE ENEM
1.0000 | ENEMA | Freq: Once | RECTAL | Status: AC
  Administered 2019-12-25: 1 via RECTAL
  Filled 2019-12-25: qty 1

## 2019-12-25 MED ORDER — MAGNESIUM SULFATE 2 GM/50ML IV SOLN
2.0000 g | Freq: Once | INTRAVENOUS | Status: DC
  Filled 2019-12-25: qty 50

## 2019-12-25 MED ORDER — POLYETHYLENE GLYCOL 3350 17 G PO PACK
17.0000 g | PACK | Freq: Two times a day (BID) | ORAL | 0 refills | Status: DC
Start: 2019-12-25 — End: 2022-04-05

## 2019-12-25 MED ORDER — POTASSIUM CHLORIDE CRYS ER 20 MEQ PO TBCR
40.0000 meq | EXTENDED_RELEASE_TABLET | Freq: Two times a day (BID) | ORAL | Status: DC
  Administered 2019-12-25: 40 meq via ORAL
  Filled 2019-12-25: qty 2

## 2019-12-25 MED ORDER — SENNOSIDES-DOCUSATE SODIUM 8.6-50 MG PO TABS
2.0000 | ORAL_TABLET | Freq: Two times a day (BID) | ORAL | 0 refills | Status: DC
Start: 2019-12-25 — End: 2022-04-05

## 2019-12-25 MED ORDER — PANTOPRAZOLE SODIUM 40 MG PO TBEC
40.0000 mg | DELAYED_RELEASE_TABLET | Freq: Every day | ORAL | 0 refills | Status: DC
Start: 2019-12-26 — End: 2022-04-05

## 2019-12-25 MED ORDER — DOCUSATE SODIUM 100 MG PO CAPS: 100.0000 mg | ORAL_CAPSULE | Freq: Two times a day (BID) | ORAL | Status: DC

## 2019-12-25 MED ORDER — OXYCODONE HCL 5 MG PO TABS: 5.0000 mg | ORAL_TABLET | Freq: Four times a day (QID) | ORAL | 0 refills | Status: AC | PRN

## 2019-12-25 MED ORDER — DOCUSATE SODIUM 100 MG PO CAPS
100.0000 mg | ORAL_CAPSULE | Freq: Two times a day (BID) | ORAL | 0 refills | Status: DC
Start: 2019-12-25 — End: 2023-06-30

## 2019-12-25 MED ORDER — SIMVASTATIN 20 MG PO TABS: 20.0000 mg | ORAL_TABLET | Freq: Every day | ORAL | 0 refills | Status: AC

## 2019-12-25 MED ORDER — MAGNESIUM SULFATE 50 % IJ SOLN
1.0000 g | Freq: Once | INTRAMUSCULAR | Status: DC
  Filled 2019-12-25: qty 2

## 2019-12-25 MED ORDER — VITAMIN D 25 MCG (1000 UNIT) PO TABS: 1000.0000 [IU] | ORAL_TABLET | Freq: Every day | ORAL | 0 refills | Status: AC

## 2019-12-25 MED ORDER — POTASSIUM CHLORIDE CRYS ER 20 MEQ PO TBCR
40.0000 meq | EXTENDED_RELEASE_TABLET | Freq: Every day | ORAL | 0 refills | Status: DC
Start: 2019-12-25 — End: 2023-06-30

## 2019-12-25 NOTE — Progress Notes (Signed)
Progress Note  5 Days Post-Op  Subjective: Patient reports she is feeling better after enema and BM yesterday. We discussed importance of mobilization and electrolyte balance. Soreness at umbilical incision present but not severe.   Objective: Vital signs in last 24 hours: Temp:  [98.6 F (37 C)-100.2 F (37.9 C)] 98.6 F (37 C) (11/24 0452) Pulse Rate:  [80-90] 80 (11/24 0452) Resp:  [16-18] 17 (11/24 0452) BP: (151-175)/(85-93) 151/85 (11/24 0500) SpO2:  [95 %-97 %] 97 % (11/24 0452) Last BM Date: 12/23/19  Intake/Output from previous day: 11/23 0701 - 11/24 0700 In: 67 [P.O.:960] Out: 200 [Urine:200] Intake/Output this shift: Total I/O In: 160 [P.O.:160] Out: -   PE: General: pleasant, WD, obese female who is sitting up in NAD HEENT: sclera anicteric Heart: regular, rate, and rhythm.   Lungs: Respiratory effort nonlabored Abd: soft, mildly ttp at umbilical incision, no cellulitis around incisions, ND, +BS, incisions c/d/i    Lab Results:  Recent Labs    12/24/19 0144 12/25/19 0053  WBC 5.6 6.9  HGB 9.8* 10.4*  HCT 29.8* 31.8*  PLT 280 304   BMET Recent Labs    12/24/19 0144 12/25/19 0053  NA 129* 130*  K 3.0* 3.0*  CL 90* 93*  CO2 28 26  GLUCOSE 111* 106*  BUN <5* <5*  CREATININE 0.61 0.70  CALCIUM 9.0 9.0   PT/INR No results for input(s): LABPROT, INR in the last 72 hours. CMP     Component Value Date/Time   NA 130 (L) 12/25/2019 0053   K 3.0 (L) 12/25/2019 0053   CL 93 (L) 12/25/2019 0053   CO2 26 12/25/2019 0053   GLUCOSE 106 (H) 12/25/2019 0053   BUN <5 (L) 12/25/2019 0053   CREATININE 0.70 12/25/2019 0053   CALCIUM 9.0 12/25/2019 0053   PROT 5.9 (L) 12/25/2019 0053   ALBUMIN 3.3 (L) 12/25/2019 0053   AST 16 12/25/2019 0053   ALT 11 12/25/2019 0053   ALKPHOS 58 12/25/2019 0053   BILITOT 0.8 12/25/2019 0053   GFRNONAA >60 12/25/2019 0053   GFRAA >90 02/14/2011 1158   Lipase     Component Value Date/Time   LIPASE 34  12/19/2019 2019       Studies/Results: No results found.  Anti-infectives: Anti-infectives (From admission, onward)   Start     Dose/Rate Route Frequency Ordered Stop   12/20/19 0800  cefTRIAXone (ROCEPHIN) 2 g in sodium chloride 0.9 % 100 mL IVPB  Status:  Discontinued        2 g 200 mL/hr over 30 Minutes Intravenous Every 24 hours 12/20/19 0739 12/22/19 1438       Assessment/Plan Hypokalemia -Potassium 3.0/magnesium 1.8 Hyponatremia -Sodium 129 Chronic back pain S/p total right knee replacement 2 weeks ago/[facility] Chronic constipation Hyperlipidemia Hypertension  Acute and chronic cholecystitis Laparoscopic cholecystectomy, 12/20/2019 Dr. Ralene Ok. Postop nausea and vomiting - POD#5 - pt had a BM after enema yesterday and is feeling better since this, recommend continuing aggressive bowel regimen and mobilization - recommend keeping K > 4.0 and Mg >2.0 to maximize bowel function  - ok to advance to soft diet  - LFTs and Tbili all WNL and WBC normal, afebrile  - surgically stable for discharge, general surgery will sign off at this time. Please call if we can be of further assistance   FEN: soft diet, decreased IVF ID: Rocephin 11/19-11/20/2021 DVT: Lovenox Follow-up: [facility] 01/09/2020@3 :45 PM Constipation:  Senokot 2 tablets twice daily; MiraLAX BID, colace BID  LOS:  3 days    [facility] 12/25/2019, 10:02 AM Please see Amion for pager number during day hours 7:00am-4:30pm

## 2019-12-25 NOTE — Discharge Summary (Signed)
Physician Discharge Summary  Raven Mclean [id number] DOB: [birth year] DOA: 12/19/2019  PCP: [facility], [facility] (Inactive)  Admit date: 12/19/2019 Discharge date: 12/25/2019  Admitted From: Home  Disposition: home   Recommendations for Outpatient Follow-up:  1. Follow up with PCP in 1-2 weeks 2. Please obtain BMP/CBC in one week 3. Follow up with sx on 12-09   Home Health: yes  Discharge Condition: Stable  CODE STATUS: Full code Diet recommendation: Heart Healthy   Brief/Interim Summary: This 64 years old female with medical history significant for chronic back pain,s/p total right knee replacement 2 weeks ago at [facility], hypertension,hyperlipidemia presents in the ED with complaints of worsening right upper quadrant abdominal pain associated with nausea and vomiting. She was found to have gallstones without evidence of acute cholecystitis. Patient is admitted for acute cholecystitis.General surgery consulted, Patient underwent laparoscopic cholecystectomy,  tolerated well but continues to have nausea and vomiting.  She continued to have abdominal pain.  Could not tolerate full liquid diet  1-Right UQ abdominal pain, Cholelithiasis;  She had a CT abdomen and pelvis with contrast done on 12/17/2019 which showed cholelithiasis with mild gallbladder distention.  No specific evidence of acute cholecystitis or biliary duct dilatation. Right upper quadrant abdominal ultrasound done on 12/20/2019 showed cholelithiasis without sonographic evidence of acute cholecystitis. General surgery Dr.Xu was consulted by EDP. Patient underwent laparoscopic cholecystectomy, tolerated well. Had BM, tolerated diet. Plan to discharge home today.  Provided 3 days oxycodone.   2-Hypokalemia; discharge on oral potasium supplement.   HTN; hold HCTZ at discharge to avoid dehydration and worsening hypokalemia.  Discharge on lopressor.   HLD; resume statins.  Constipation; discharge on bowel  regimen.   Discharge Diagnoses:  Active Problems:   RUQ abdominal pain   Cholelithiasis    Discharge Instructions  Discharge Instructions    Diet - low sodium heart healthy   Complete by: As directed    Discharge wound care:   Complete by: As directed    See surgery recommendation   Increase activity slowly   Complete by: As directed      Allergies as of 12/25/2019      Reactions   Hydrocodone-acetaminophen Itching      Medication List    STOP taking these medications   hydrochlorothiazide 25 MG tablet Commonly known as: HYDRODIURIL   lidocaine 5 % Commonly known as: LIDODERM     TAKE these medications   acetaminophen 500 MG tablet Commonly known as: TYLENOL Take 1,000 mg by mouth every 8 (eight) hours as needed for mild pain, fever or headache.   alum & mag hydroxide-simeth 498-264-15 MG/5ML suspension Commonly known as: Maalox Max Take 15 mLs by mouth every 6 (six) hours as needed for indigestion.   cholecalciferol 25 MCG (1000 UNIT) tablet Commonly known as: VITAMIN D3 Take 1 tablet (1,000 Units total) by mouth daily.   diclofenac Sodium 1 % Gel Commonly known as: VOLTAREN Apply 2 g topically 4 (four) times daily as needed (pain).   docusate sodium 100 MG capsule Commonly known as: COLACE Take 1 capsule (100 mg total) by mouth 2 (two) times daily. What changed:   when to take this  reasons to take this   metoprolol tartrate 25 MG tablet Commonly known as: LOPRESSOR Take 0.5 tablets (12.5 mg total) by mouth daily. Start taking on: December 26, 2019 What changed:   medication strength  how much to take   multivitamin with minerals tablet Take 1 tablet by mouth daily.  ondansetron 4 MG disintegrating tablet Commonly known as: Zofran ODT 4mg  ODT q4 hours prn nausea/vomit What changed:   how much to take  how to take this  when to take this  reasons to take this  additional instructions   oxyCODONE 5 MG immediate release  tablet Commonly known as: Oxy IR/ROXICODONE Take 1 tablet (5 mg total) by mouth 4 (four) times daily as needed for up to 3 days. What changed: reasons to take this   pantoprazole 40 MG tablet Commonly known as: PROTONIX Take 1 tablet (40 mg total) by mouth daily. Start taking on: December 26, 2019   polyethylene glycol 17 g packet Commonly known as: MIRALAX / GLYCOLAX Take 17 g by mouth 2 (two) times daily.   potassium chloride SA 20 MEQ tablet Commonly known as: KLOR-CON Take 2 tablets (40 mEq total) by mouth daily.   senna-docusate 8.6-50 MG tablet Commonly known as: Senokot-S Take 2 tablets by mouth 2 (two) times daily.   simvastatin 20 MG tablet Commonly known as: ZOCOR Take 1 tablet (20 mg total) by mouth daily.            Discharge Care Instructions  (From admission, onward)         Start     Ordered   12/25/19 0000  Discharge wound care:       Comments: See surgery recommendation   12/25/19 1632          Follow-up Information    Surgery, [place]. Go on 01/09/2020.   Specialty: General Surgery Why: Follow up appointment scheduled for 3:45 PM. Please arrive 30 min prior to appointment. Bring photo ID and insurance information.  Contact information: [place] [address] [phone]        [facility], [facility] Follow up.   Specialty: Home Health Services Contact information: [place] [address] [phone]              Allergies  Allergen Reactions  . Hydrocodone-Acetaminophen Itching    Consultations:  Surgery    Procedures/Studies: CT ABDOMEN PELVIS W CONTRAST  Result Date: 12/17/2019 CLINICAL DATA:  Abdominal pain and emesis. EXAM: CT ABDOMEN AND PELVIS WITH CONTRAST TECHNIQUE: Multidetector CT imaging of the abdomen and pelvis was performed using the standard protocol following bolus administration of intravenous contrast. CONTRAST:  118mL OMNIPAQUE IOHEXOL 300  MG/ML  SOLN COMPARISON:  None. FINDINGS: Lower chest: Bibasilar dependent atelectasis. Mild cardiomegaly, without pericardial or pleural effusion. Hepatobiliary: Segment 4 B 1.3 cm well-circumscribed low-density lesion, favoring a cyst or minimally complex cyst. Stone filled gallbladder which is mildly distended. No specific evidence of acute cholecystitis or biliary duct dilatation. Pancreas: Normal, without mass or ductal dilatation. Spleen: Normal in size, without focal abnormality. Adrenals/Urinary Tract: Normal adrenal glands. Normal kidneys, without hydronephrosis. Normal urinary bladder. Stomach/Bowel: Gastric antral underdistention. Scattered colonic diverticula. Normal terminal ileum. Long, tortuous appendix, terminating in the upper central pelvis. No appendicitis. Normal small bowel. No free intraperitoneal air. Vascular/Lymphatic: Aortic atherosclerosis. Prominent but not pathologically sized abdominal retroperitoneal nodes which are likely reactive. Right external iliac upper normal 1.0 cm node on 74/3. Right inguinal node is also borderline enlarged at 1.0 cm. Reproductive: Normal uterus and adnexa. Other: No significant free fluid. Musculoskeletal: Lumbosacral spondylosis. IMPRESSION: 1. Cholelithiasis with mild gallbladder distension. No specific evidence of acute cholecystitis or biliary duct dilatation. 2. Aortic Atherosclerosis (ICD10-I70.0). 3. Borderline size right pelvic and inguinal nodes, favored to be reactive. Electronically  Signed   By: Abigail Miyamoto M.D.   On: 12/17/2019 17:25   US Abdomen Limited RUQ (LIVER/GB)  Result Date: 12/20/2019 CLINICAL DATA:  Right upper quadrant pain, nausea, vomiting EXAM: ULTRASOUND ABDOMEN LIMITED RIGHT UPPER QUADRANT COMPARISON:  12/17/2019 FINDINGS: Gallbladder: Multiple mobile gallstones are again seen within the gallbladder measuring up to 2.2 cm in greatest dimension. The gallbladder, however, is not distended, there is no gallbladder wall  thickening, and no pericholecystic fluid is identified. The sonographic Percell Miller sign is reportedly negative. Common bile duct: Diameter: 2 mm in proximal diameter Liver: No focal lesion identified. Within normal limits in parenchymal echogenicity. Portal vein is patent on color Doppler imaging with normal direction of blood flow towards the liver. Other: None. IMPRESSION: Cholelithiasis without sonographic evidence of acute cholecystitis Electronically Signed   By: Fidela Salisbury MD   On: 12/20/2019 03:23   US Abdomen Limited RUQ (LIVER/GB)  Result Date: 12/17/2019 CLINICAL DATA:  Abdominal pain.  Gallstones noted on recent CT scan. EXAM: ULTRASOUND ABDOMEN LIMITED RIGHT UPPER QUADRANT COMPARISON:  CT scan, same date. FINDINGS: Gallbladder: There are numerous gallstones noted in the gallbladder. The largest measures 2.5 cm. No gallbladder wall thickening, pericholecystic fluid or sonographic Murphy sign to suggest acute cholecystitis. Common bile duct: Diameter: 5.0 mm Liver: Normal hepatic echogenicity. No focal lesions are identified. The small peripheral cyst noted on the CT scan is not identified for certain. No intrahepatic biliary dilatation. Portal vein is patent on color Doppler imaging with normal direction of blood flow towards the liver. Other: None. IMPRESSION: 1. Cholelithiasis but no CT findings for acute cholecystitis. 2. No biliary dilatation. 3. Normal liver. Electronically Signed   By: Marijo Sanes M.D.   On: 12/17/2019 19:49     Subjective: Feeling better , had BM. Ready to go home  Discharge Exam: Vitals:   12/25/19 0500 12/25/19 1332  BP: (!) 151/85 (!) 144/76  Pulse:  97  Resp:  18  Temp:  97.6 F (36.4 C)  SpO2:  98%     General: Pt is alert, awake, not in acute distress Cardiovascular: RRR, S1/S2 +, no rubs, no gallops Respiratory: CTA bilaterally, no wheezing, no rhonchi Abdominal: Soft, NT, ND, bowel sounds + Extremities: no edema, no cyanosis    The results  of significant diagnostics from this hospitalization (including imaging, microbiology, ancillary and laboratory) are listed below for reference.     Microbiology: Recent Results (from the past 240 hour(s))  Respiratory Panel by RT PCR (Flu A&B, Covid) - Nasopharyngeal Swab     Status: None   Collection Time: 12/20/19  4:03 AM   Specimen: Nasopharyngeal Swab; Nasopharyngeal(NP) swabs in vial transport medium  Result Value Ref Range Status   SARS Coronavirus 2 by RT PCR NEGATIVE NEGATIVE Final    Comment: (NOTE) SARS-CoV-2 target nucleic acids are NOT DETECTED.  The SARS-CoV-2 RNA is generally detectable in upper respiratoy specimens during the acute phase of infection. The lowest concentration of SARS-CoV-2 viral copies this assay can detect is 131 copies/mL. A negative result does not preclude SARS-Cov-2 infection and should not be used as the sole basis for treatment or other patient management decisions. A negative result may occur with  improper specimen collection/handling, submission of specimen other than nasopharyngeal swab, presence of viral mutation(s) within the areas targeted by this assay, and inadequate number of viral copies (<131 copies/mL). A negative result must be combined with clinical observations, patient history, and epidemiological information. The expected result is Negative.  Fact Sheet  for Patients:  PinkCheek.be  Fact Sheet for Healthcare Providers:  GravelBags.it  This test is no t yet approved or cleared by the [place] FDA and  has been authorized for detection and/or diagnosis of SARS-CoV-2 by FDA under an Emergency Use Authorization (EUA). This EUA will remain  in effect (meaning this test can be used) for the duration of the COVID-19 declaration under Section 564(b)(1) of the Act, 21 U.S.C. section 360bbb-3(b)(1), unless the authorization is terminated or revoked sooner.      Influenza A by PCR NEGATIVE NEGATIVE Final   Influenza B by PCR NEGATIVE NEGATIVE Final    Comment: (NOTE) The Xpert Xpress SARS-CoV-2/FLU/RSV assay is intended as an aid in  the diagnosis of influenza from Nasopharyngeal swab specimens and  should not be used as a sole basis for treatment. Nasal washings and  aspirates are unacceptable for Xpert Xpress SARS-CoV-2/FLU/RSV  testing.  Fact Sheet for Patients: PinkCheek.be  Fact Sheet for Healthcare Providers: GravelBags.it  This test is not yet approved or cleared by the [place] FDA and  has been authorized for detection and/or diagnosis of SARS-CoV-2 by  FDA under an Emergency Use Authorization (EUA). This EUA will remain  in effect (meaning this test can be used) for the duration of the  Covid-19 declaration under Section 564(b)(1) of the Act, 21  U.S.C. section 360bbb-3(b)(1), unless the authorization is  terminated or revoked. Performed at [facility], [place] [address], [place], [place] [address]      Labs: BNP (last 3 results) No results for input(s): BNP in the last 8760 hours. Basic Metabolic Panel: Recent Labs  Lab 12/20/19 0453 12/20/19 0453 12/21/19 0242 12/22/19 0309 12/23/19 0301 12/24/19 0144 12/25/19 0053  NA 135   < > 136 135 132* 129* 130*  K 3.1*   < > 3.5 3.0* 3.3* 3.0* 3.0*  CL 98   < > 102 98 96* 90* 93*  CO2 25   < > 23 28 27 28 26   GLUCOSE 138*   < > 119* 116* 107* 111* 106*  BUN 9   < > 12 11 5* <5* <5*  CREATININE 0.74   < > 0.80 0.69 0.71 0.61 0.70  CALCIUM 9.0   < > 9.2 8.9 8.9 9.0 9.0  MG 1.7  --  1.9 1.9  --  1.8 1.8  PHOS  --   --  3.8 2.3* 3.1 3.4 3.3   < > = values in this interval not displayed.   Liver Function Tests: Recent Labs  Lab 12/21/19 0242 12/22/19 0309 12/23/19 0301 12/24/19 0144 12/25/19 0053  AST 39 26 22 18 16   ALT 20 20 17 14 11   ALKPHOS 52 41 52 54 58  BILITOT 0.9 0.6 0.9 0.8 0.8  PROT 6.8  6.1* 6.0* 6.3* 5.9*  ALBUMIN 3.5 3.2* 3.2* 3.2* 3.3*   Recent Labs  Lab 12/19/19 2019  LIPASE 34   No results for input(s): AMMONIA in the last 168 hours. CBC: Recent Labs  Lab 12/21/19 0242 12/21/19 0242 12/22/19 0309 12/22/19 1003 12/23/19 0301 12/24/19 0144 12/25/19 0053  WBC 7.2  --  6.8  --  8.1 5.6 6.9  HGB 10.9*   < > 8.9* 10.2* 9.3* 9.8* 10.4*  HCT 34.3*   < > 27.3* 31.3* 28.3* 29.8* 31.8*  MCV 100.6*  --  99.6  --  97.6 97.1 97.5  PLT 312  --  272  --  292 280 304   < > =  values in this interval not displayed.   Cardiac Enzymes: No results for input(s): CKTOTAL, CKMB, CKMBINDEX, TROPONINI in the last 168 hours. BNP: Invalid input(s): POCBNP CBG: No results for input(s): GLUCAP in the last 168 hours. D-Dimer No results for input(s): DDIMER in the last 72 hours. Hgb A1c No results for input(s): HGBA1C in the last 72 hours. Lipid Profile No results for input(s): CHOL, HDL, LDLCALC, TRIG, CHOLHDL, LDLDIRECT in the last 72 hours. Thyroid function studies No results for input(s): TSH, T4TOTAL, T3FREE, THYROIDAB in the last 72 hours.  Invalid input(s): FREET3 Anemia work up No results for input(s): VITAMINB12, FOLATE, FERRITIN, TIBC, IRON, RETICCTPCT in the last 72 hours. Urinalysis    Component Value Date/Time   COLORURINE YELLOW 12/19/2019 1954   APPEARANCEUR CLEAR 12/19/2019 1954   LABSPEC 1.018 12/19/2019 1954   PHURINE 7.0 12/19/2019 1954   GLUCOSEU NEGATIVE 12/19/2019 [place] NEGATIVE 12/19/2019 [place] NEGATIVE 12/19/2019 1954   KETONESUR 80 (A) 12/19/2019 [place] NEGATIVE 12/19/2019 1954   NITRITE NEGATIVE 12/19/2019 1954   LEUKOCYTESUR NEGATIVE 12/19/2019 1954   Sepsis Labs Invalid input(s): PROCALCITONIN,  WBC,  LACTICIDVEN Microbiology Recent Results (from the past 240 hour(s))  Respiratory Panel by RT PCR (Flu A&B, Covid) - Nasopharyngeal Swab     Status: None   Collection Time: 12/20/19  4:03 AM   Specimen:  Nasopharyngeal Swab; Nasopharyngeal(NP) swabs in vial transport medium  Result Value Ref Range Status   SARS Coronavirus 2 by RT PCR NEGATIVE NEGATIVE Final    Comment: (NOTE) SARS-CoV-2 target nucleic acids are NOT DETECTED.  The SARS-CoV-2 RNA is generally detectable in upper respiratoy specimens during the acute phase of infection. The lowest concentration of SARS-CoV-2 viral copies this assay can detect is 131 copies/mL. A negative result does not preclude SARS-Cov-2 infection and should not be used as the sole basis for treatment or other patient management decisions. A negative result may occur with  improper specimen collection/handling, submission of specimen other than nasopharyngeal swab, presence of viral mutation(s) within the areas targeted by this assay, and inadequate number of viral copies (<131 copies/mL). A negative result must be combined with clinical observations, patient history, and epidemiological information. The expected result is Negative.  Fact Sheet for Patients:  PinkCheek.be  Fact Sheet for Healthcare Providers:  GravelBags.it  This test is no t yet approved or cleared by the [place] FDA and  has been authorized for detection and/or diagnosis of SARS-CoV-2 by FDA under an Emergency Use Authorization (EUA). This EUA will remain  in effect (meaning this test can be used) for the duration of the COVID-19 declaration under Section 564(b)(1) of the Act, 21 U.S.C. section 360bbb-3(b)(1), unless the authorization is terminated or revoked sooner.     Influenza A by PCR NEGATIVE NEGATIVE Final   Influenza B by PCR NEGATIVE NEGATIVE Final    Comment: (NOTE) The Xpert Xpress SARS-CoV-2/FLU/RSV assay is intended as an aid in  the diagnosis of influenza from Nasopharyngeal swab specimens and  should not be used as a sole basis for treatment. Nasal washings and  aspirates are unacceptable for Xpert  Xpress SARS-CoV-2/FLU/RSV  testing.  Fact Sheet for Patients: PinkCheek.be  Fact Sheet for Healthcare Providers: GravelBags.it  This test is not yet approved or cleared by the [place] FDA and  has been authorized for detection and/or diagnosis of SARS-CoV-2 by  FDA under an Emergency Use Authorization (EUA). This EUA will remain  in effect (meaning this  test can be used) for the duration of the  Covid-19 declaration under Section 564(b)(1) of the Act, 21  U.S.C. section 360bbb-3(b)(1), unless the authorization is  terminated or revoked. Performed at [facility], [place] [address], [place], [place] [address]      Time coordinating discharge: 40 minutes  SIGNED:   Elmarie Shiley, MD  [facility]

## 2019-12-25 NOTE — Care Management Important Message (Signed)
Important Message  Patient Details  Name: Raven Mclean MRN: [id number] Date of Birth: [birth year]   Medicare Important Message Given:  Yes   Due to staffing patient room was called no answer IM documents mailed to the patient home address   Orbie Pyo 12/25/2019, 4:24 PM

## 2019-12-25 NOTE — Evaluation (Signed)
Physical Therapy Evaluation Patient Details Name: Raven Mclean MRN: [id number] DOB: 05/14/1955 Today's Date: 12/25/2019   History of Present Illness  Pt is a 64 y/o female admitted secondary to R upper abdomen abdominal pain. Found to have acute cholecystitis and is s/p lap chole. PMH includes recent R TKA, HTN, and chronic back pain.   Clinical Impression  Pt admitted secondary to problem above with deficits below. Pt limited in gait distance secondary to pain. Required min guard A for ambulation with RW. Pt with recent TKA and has been working with [place]. Recommend resumption of HHPT at d/c. Will continue to follow acutely.     Follow Up Recommendations Home health PT (resume HHPT )    Equipment Recommendations  None recommended by PT    Recommendations for Other Services       Precautions / Restrictions Precautions Precautions: Fall;Knee Precaution Comments: Recent R TKA  Restrictions Weight Bearing Restrictions: No      Mobility  Bed Mobility               General bed mobility comments: In chair upon entry     Transfers Overall transfer level: Needs assistance Equipment used: Rolling walker (2 wheeled) Transfers: Sit to/from Stand Sit to Stand: Min guard         General transfer comment: Min guard for safety.   Ambulation/Gait Ambulation/Gait assistance: Min guard Gait Distance (Feet): 25 Feet Assistive device: Rolling walker (2 wheeled) Gait Pattern/deviations: Step-through pattern;Antalgic;Decreased weight shift to right Gait velocity: Decreased   General Gait Details: Mildly antalgic gait. Distance limited secondary to pain and fatigue.  Stairs            Wheelchair Mobility    Modified Rankin (Stroke Patients Only)       Balance Overall balance assessment: Mild deficits observed, not formally tested                                           Pertinent Vitals/Pain Pain Assessment: Faces Faces Pain Scale: Hurts  little more Pain Location: abdomen and R knee  Pain Descriptors / Indicators: Aching Pain Intervention(s): Monitored during session;Limited activity within patient's tolerance;Repositioned    Home Living Family/patient expects to be discharged to:: Private residence Living Arrangements: Parent Available Help at Discharge: Family Type of Home: Apartment Home Access: Level entry     Home Layout: One level Home Equipment: Environmental consultant - 2 wheels      Prior Function Level of Independence: Independent with assistive device(s)         Comments: Using RW after knee surgery      Hand Dominance        Extremity/Trunk Assessment   Upper Extremity Assessment Upper Extremity Assessment: Overall WFL for tasks assessed    Lower Extremity Assessment Lower Extremity Assessment: RLE deficits/detail RLE Deficits / Details: Recent R TKA     Cervical / Trunk Assessment Cervical / Trunk Assessment: Normal  Communication   Communication: No difficulties  Cognition Arousal/Alertness: Awake/alert Behavior During Therapy: WFL for tasks assessed/performed Overall Cognitive Status: Within Functional Limits for tasks assessed                                        General Comments      Exercises Total Joint Exercises Ankle Circles/Pumps:  AROM;Both;10 reps Long Arc Quad: AAROM;Right;10 reps   Assessment/Plan    PT Assessment Patient needs continued PT services  PT Problem List Decreased strength;Decreased mobility;Decreased activity tolerance;Decreased balance;Pain       PT Treatment Interventions Gait training;DME instruction;Therapeutic activities;Functional mobility training;Therapeutic exercise;Balance training;Patient/family education    PT Goals (Current goals can be found in the Care Plan section)  Acute Rehab PT Goals Patient Stated Goal: to go home PT Goal Formulation: With patient Time For Goal Achievement: 01/08/20 Potential to Achieve Goals: Good     Frequency Min 3X/week   Barriers to discharge        Co-evaluation               AM-PAC PT "6 Clicks" Mobility  Outcome Measure Help needed turning from your back to your side while in a flat bed without using bedrails?: None Help needed moving from lying on your back to sitting on the side of a flat bed without using bedrails?: A Little Help needed moving to and from a bed to a chair (including a wheelchair)?: A Little Help needed standing up from a chair using your arms (e.g., wheelchair or bedside chair)?: A Little Help needed to walk in hospital room?: A Little Help needed climbing 3-5 steps with a railing? : A Lot 6 Click Score: 18    End of Session Equipment Utilized During Treatment: Gait belt Activity Tolerance: Patient limited by pain Patient left: in chair;with call bell/phone within reach Nurse Communication: Mobility status PT Visit Diagnosis: Other abnormalities of gait and mobility (R26.89)    Time: 1340-1355 PT Time Calculation (min) (ACUTE ONLY): 15 min   Charges:   PT Evaluation $PT Eval Low Complexity: 1 Low          Lou Miner, DPT  Acute Rehabilitation Services  Pager: [phone] Office: [phone]   Rudean Hitt 12/25/2019, 3:30 PM

## 2019-12-25 NOTE — TOC Progression Note (Addendum)
Transition of Care [facility]) - Progression Note    Patient Details  Name: Raven Mclean MRN: [id number] Date of Birth: Dec 05, 1955  Transition of Care [facility]) CM/SW Contact  Jacalyn Lefevre Edson Snowball, RN Phone Number: 12/25/2019, 4:12 PM  Clinical Narrative:     Patient from home active with [facility] and wants to continue. Patient states she has walker and 3 in 1 at home.   Patient requesting ride home. Confirmed address. Messaged doctor to see if safe for taxi  [employer] given to bedside nurse  Expected Discharge Plan: Home/Self Care Barriers to Discharge: Continued Medical Work up  Expected Discharge Plan and Services Expected Discharge Plan: Home/Self Care   Discharge Planning Services: NA   Living arrangements for the past 2 months: Single Family Home                 DME Arranged: N/A DME Agency: NA       HH Arranged: NA           Social Determinants of Health (SDOH) Interventions    Readmission Risk Interventions No flowsheet data found.

## 2019-12-25 NOTE — Progress Notes (Signed)
Raven Mclean to be D/C'd  per MD order.  Discussed with the patient and all questions fully answered.  VSS, Skin clean, dry and intact without evidence of skin break down, no evidence of skin tears noted.  IV catheter discontinued intact. Site without signs and symptoms of complications. Dressing and pressure applied.  An After Visit Summary was printed and given to the patient. Patient received prescription.  D/c education completed with patient/family including follow up instructions, medication list, d/c activities limitations if indicated, with other d/c instructions as indicated by MD - patient able to verbalize understanding, all questions fully answered.   Patient instructed to return to ED, call 911, or call MD for any changes in condition.   Patient to be escorted via [place], and D/C home via private auto.

## 2019-12-25 NOTE — Plan of Care (Signed)
  Problem: Education: Goal: Knowledge of General Education information will improve Description: Including pain rating scale, medication(s)/side effects and non-pharmacologic comfort measures Outcome: Progressing   Problem: Health Behavior/Discharge Planning: Goal: Ability to manage health-related needs will improve Outcome: Progressing   Problem: Clinical Measurements: Goal: Ability to maintain clinical measurements within normal limits will improve Outcome: Progressing Goal: Will remain free from infection Outcome: Completed/Met Goal: Diagnostic test results will improve Outcome: Progressing Goal: Respiratory complications will improve Outcome: Completed/Met Goal: Cardiovascular complication will be avoided Outcome: Completed/Met   Problem: Activity: Goal: Risk for activity intolerance will decrease Outcome: Progressing   Problem: Nutrition: Goal: Adequate nutrition will be maintained Outcome: Progressing   Problem: Coping: Goal: Level of anxiety will decrease Outcome: Progressing   Problem: Elimination: Goal: Will not experience complications related to bowel motility Outcome: Progressing Goal: Will not experience complications related to urinary retention Outcome: Progressing   Problem: Safety: Goal: Ability to remain free from injury will improve Outcome: Progressing   Problem: Skin Integrity: Goal: Risk for impaired skin integrity will decrease Outcome: Progressing   Problem: Education: Goal: Required Educational Video(s) Outcome: Progressing   Problem: Clinical Measurements: Goal: Postoperative complications will be avoided or minimized Outcome: Progressing

## 2020-01-28 ENCOUNTER — Ambulatory Visit: Payer: Medicare HMO | Attending: Internal Medicine | Admitting: Physical Therapy

## 2020-01-28 ENCOUNTER — Encounter: Payer: Self-pay | Admitting: Physical Therapy

## 2020-01-28 ENCOUNTER — Other Ambulatory Visit: Payer: Self-pay

## 2020-01-28 DIAGNOSIS — M25561 Pain in right knee: Secondary | ICD-10-CM | POA: Insufficient documentation

## 2020-01-28 DIAGNOSIS — M25661 Stiffness of right knee, not elsewhere classified: Secondary | ICD-10-CM | POA: Diagnosis present

## 2020-01-28 DIAGNOSIS — R600 Localized edema: Secondary | ICD-10-CM | POA: Insufficient documentation

## 2020-01-28 DIAGNOSIS — R2689 Other abnormalities of gait and mobility: Secondary | ICD-10-CM | POA: Insufficient documentation

## 2020-01-29 NOTE — Therapy (Addendum)
[facility] [address] [place], [place], [address] Phone: [phone]   Fax:  [phone]  Physical Therapy Evaluation  Patient Details  Name: Raven Mclean MRN: [id number] Date of Birth: 11-09-1955 Referring Provider (PT): Dr Neomia Dear   Encounter Date: 01/28/2020   PT End of Session - 01/28/20 0915    Visit Number 1    Number of Visits 16    Date for PT Re-Evaluation 03/25/20    Authorization Type [employer]    PT Start Time 0900   Patirent 15 minutes late   PT Stop Time 0930    PT Time Calculation (min) 30 min    Activity Tolerance Patient tolerated treatment well    Behavior During Therapy [facility] for tasks assessed/performed           Past Medical History:  Diagnosis Date  . Arthritis   . Blood transfusion   . Chronic back pain   . Hyperlipidemia   . Hypertension   . Neuromuscular disorder (HCC)    spasms in legs    Past Surgical History:  Procedure Laterality Date  . BACK SURGERY    . CESAREAN SECTION    . CHOLECYSTECTOMY N/A 12/20/2019   Procedure: LAPAROSCOPIC CHOLECYSTECTOMY;  Surgeon: Axel Filler, MD;  Location: [facility] OR;  Service: General;  Laterality: N/A;  . COLONOSCOPY    . POLYPECTOMY    . TRIGGER FINGER RELEASE      There were no vitals filed for this visit.    Subjective Assessment - 01/28/20 0906    Subjective Patient had a right TKA on 11/26/2019. In the course of her rehab had to go back in the hospital for a gall bladder removal. She has been through home health. She is currently using a cane. She wasn't using a cane prior to her surgery.    Pertinent History recent gall bladder removal, hip bursitis, 2x back surgeries,    Limitations Standing;Walking    How long can you sit comfortably? stiffens at times    How long can you stand comfortably? can stand to do dishes    How long can you walk comfortably? feels better when she is walking    Diagnostic tests nothing post-op    Patient  Stated Goals to return to active lifestyle    Currently in Pain? Yes    Pain Score 6     Pain Location Knee    Pain Orientation Right    Pain Descriptors / Indicators Aching    Pain Type Chronic pain    Pain Onset More than a month ago    Pain Frequency Constant    Aggravating Factors  night time,    Pain Relieving Factors massage, ice pack    Effect of Pain on Daily Activities walking with device, pain at night              [facility] PT Assessment - 01/29/20 0001      Assessment   Medical Diagnosis R TKA    Referring Provider (PT) Dr Neomia Dear    Onset Date/Surgical Date 11/26/19    Hand Dominance Right    Next MD Visit Next month    Prior Therapy None      Precautions   Precautions None      Restrictions   Weight Bearing Restrictions No      Balance Screen   Has the patient fallen in the past 6 months No    Has the patient had a decrease  in activity level because of a fear of falling?  No    Is the patient reluctant to leave their home because of a fear of falling?  No      Home Ecologist residence    Additional Comments level entrance intoher apartment      Prior Function   Level of [place] Retired    Leisure likes to walk and go to the gym      Cognition   Overall Cognitive Status Within Functional Limits for tasks assessed    Attention Focused    Focused Attention Appears intact    Memory Appears intact    Awareness Appears intact    Problem Solving Appears intact      Observation/Other Assessments   Focus on Therapeutic Outcomes (FOTO)  65% limitation      Sensation   Light Touch Appears Intact    Additional Comments denies parathesias      Coordination   Gross Motor Movements are Fluid and Coordinated Yes    Fine Motor Movements are Fluid and Coordinated Yes      AROM   Right Knee Extension -9    Right Knee Flexion 100      PROM   Right Knee Extension 7    Right Knee Flexion 107       Strength   Right Hip Flexion 4/5    Right Hip ABduction 4+/5    Right Hip ADduction 4+/5    Left Hip Flexion 5/5    Left Hip ABduction 5/5    Left Hip ADduction 5/5    Right Knee Flexion 4+/5    Right Knee Extension 4/5      Palpation   Palpation comment tenderness topalpation in the posterior knee.      Ambulation/Gait   Gait Comments decreased weight bearing on the right LE but minimal differnce between the left and right; mild decrease in right hip flexion                      Objective measurements completed on examination: See above findings.       [facility] Adult PT Treatment/Exercise - 01/29/20 0001      Exercises   Exercises Knee/Hip      Knee/Hip Exercises: Stretches   Other Knee/Hip Stretches reviewed extension stretch                  PT Education - 01/28/20 0910    Education Details HEP and symptom mangement, self extension stretching    Person(s) Educated Patient    Methods Explanation;Demonstration;Verbal cues;Tactile cues    Comprehension Verbalized understanding;Returned demonstration;Tactile cues required;Verbal cues required            PT Short Term Goals - 01/28/20 1305      PT SHORT TERM GOAL #1   Title Patient will be independent with basic HEP    Time 3    Period Weeks    Status New    Target Date 02/18/20      PT SHORT TERM GOAL #2   Title Therapy will review FOTO score    Time 1    Period Weeks    Status New    Target Date 02/04/20      PT SHORT TERM GOAL #3   Title Patient will increase gross right LE strength to 4+/5    Time 3    Period Weeks    Status  New    Target Date 02/18/20      PT SHORT TERM GOAL #4   Title Patient will increase tolat arc of motion to 3-115    Time 3    Period Weeks    Status New    Target Date 02/18/20             PT Long Term Goals - 01/28/20 1331      PT LONG TERM GOAL #1   Title Patient will go up and down 8 stpes with recipracal pattern    Time 6    Period  Weeks    Status New    Target Date 03/10/20      PT LONG TERM GOAL #2   Title Patient will stand and ambualte 3000' without a device without pain in order to go shopping    Time 6    Period Weeks    Status New    Target Date 03/11/20                  Plan - 01/28/20 0916    Clinical Impression Statement [name] S/P right TKA on 11/26/2019. She presents with expected limitations in motion, strength, and functional mobility. Her range is progressing well. She has limited weight bearing on the right side but good considering where she is at post-op. She has a high co-pay. We will work on her range and progressing her exercises while she is in clinic. She has access to a gym. She would benefit from skilled therapy to return to a pain free active lifestyle. Total arc measured at 7-107.    Personal Factors and Comorbidities Comorbidity 1;Comorbidity 3+;Comorbidity 2    Comorbidities right ip bursitiss, mulitple back surgerys, recent gallblader removal    Examination-Activity Limitations Locomotion Level;Sit;Squat;Dressing;Lift;Stand    Examination-Participation Restrictions Cleaning;Community Activity;Laundry;Shop    Stability/Clinical Decision Making Stable/Uncomplicated   improving post-op   Clinical Decision Making Low    Rehab Potential Excellent    PT Frequency 2x / week    PT Duration 8 weeks    PT Treatment/Interventions ADLs/Self Care Home Management;Electrical Stimulation;Cryotherapy;Iontophoresis 4mg /ml Dexamethasone;Ultrasound;DME Instruction;Gait training;Stair training;Therapeutic activities;Therapeutic exercise;Neuromuscular re-education;Patient/family education;Manual techniques;Passive range of motion;Splinting    PT Next Visit Plan Patient reports her biggest difficulty is staris. Work on progressive stair training; review exercises; work on extension stretching. Limited review of HEP 2nd to patient being 15 minutes late. Progress standing exercises  as tolerated. gait training without the cane as appropriate    PT Home Exercise Plan reviewe self extension stretch    Consulted and Agree with Plan of Care Patient           Patient will benefit from skilled therapeutic intervention in order to improve the following deficits and impairments:  Abnormal gait,Decreased range of motion,Difficulty walking,Decreased endurance,Decreased activity tolerance,Pain,Decreased balance,Decreased mobility,Decreased strength  Visit Diagnosis: Acute pain of right knee  Stiffness of right knee, not elsewhere classified  Other abnormalities of gait and mobility  Localized edema   Referring diagnosis? Right TKA Treatment diagnosis? (if different than referring diagnosis)  What was this (referring dx) caused by? [x]  Surgery []  Fall []  Ongoing issue []  Arthritis []  Other: ____________  Laterality: [x]  Rt []  Lt []  Both  Check all possible CPT codes:      []  97110 (Therapeutic Exercise)  []  92507 (SLP Treatment)  []  97112 (Neuro Re-ed)   []  92526 (Swallowing Treatment)   []  97116 (Gait Training)   []  [id number] (Cognitive  Training, 1st 15 minutes) []  97140 (Manual Therapy)   []  97130 (Cognitive Training, each add'l 15 minutes)  []  97530 (Therapeutic Activities)  []  Other, List CPT Code ____________    []  [id number] (Self Care)       [x]  All codes above (97110 - 97535)  []  97012 (Mechanical Traction)  [x]  97014 (E-stim Unattended)  []  97032 (E-stim manual)  []  97033 (Ionto)  [x]  97035 (Ultrasound)  []  97760 (Orthotic Fit) []  [id number] (Physical Performance Training) []  [id number] (Aquatic Therapy) []  57846 (Contrast Bath) []  96295 (Paraffin) []  97597 (Wound Care 1st 20 sq cm) []  97598 (Wound Care each add'l 20 sq cm) []  97016 (Vasopneumatic Device) []  [phone] Comptroller) []  [id number] (Prosthetic Training)    Problem List Patient Active Problem List   Diagnosis Date Noted  . Cholelithiasis 12/22/2019  . RUQ abdominal pain 12/20/2019  .  Hypertension 03/15/2011  . Obesity, Class II, BMI 35-39.9, with comorbidity 03/15/2011  . Chronic back pain     Carney Living PT DPT  01/29/2020, 12:15 PM  [facility] [address] [place], [place], [address] Phone: [phone]   Fax:  [phone]  Name: Raven Mclean MRN: [id number] Date of Birth: 1955-04-11

## 2020-01-29 NOTE — Patient Instructions (Signed)
Patient has a home HEP. She was advised to continue with that. She was advised to work on knee extension at home.

## 2020-02-11 ENCOUNTER — Ambulatory Visit: Payer: Medicare HMO

## 2020-02-12 ENCOUNTER — Ambulatory Visit: Payer: Medicare HMO | Attending: Internal Medicine | Admitting: Physical Therapy

## 2020-02-12 ENCOUNTER — Encounter: Payer: Self-pay | Admitting: Physical Therapy

## 2020-02-12 ENCOUNTER — Other Ambulatory Visit: Payer: Self-pay

## 2020-02-12 DIAGNOSIS — R600 Localized edema: Secondary | ICD-10-CM | POA: Diagnosis present

## 2020-02-12 DIAGNOSIS — M25561 Pain in right knee: Secondary | ICD-10-CM | POA: Diagnosis not present

## 2020-02-12 DIAGNOSIS — R2689 Other abnormalities of gait and mobility: Secondary | ICD-10-CM | POA: Insufficient documentation

## 2020-02-12 DIAGNOSIS — M25661 Stiffness of right knee, not elsewhere classified: Secondary | ICD-10-CM | POA: Insufficient documentation

## 2020-02-13 NOTE — Therapy (Signed)
[place], [place], [address] Phone: [phone]   Fax:  [phone]  Physical Therapy Treatment  Patient Details  Name: Raven Mclean MRN: [id number] Date of Birth: 03/06/1955 Referring Provider (PT): Dr Vara Guardian   Encounter Date: 02/12/2020   PT End of Session - 02/12/20 1205    Visit Number 2    Number of Visits 16    Date for PT Re-Evaluation 03/25/20    PT Start Time 9485    PT Stop Time 1226    PT Time Calculation (min) 41 min    Activity Tolerance Patient tolerated treatment well    Behavior During Therapy [facility] for tasks assessed/performed           Past Medical History:  Diagnosis Date  . Arthritis   . Blood transfusion   . Chronic back pain   . Hyperlipidemia   . Hypertension   . Neuromuscular disorder (HCC)    spasms in legs    Past Surgical History:  Procedure Laterality Date  . BACK SURGERY    . CESAREAN SECTION    . CHOLECYSTECTOMY N/A 12/20/2019   Procedure: LAPAROSCOPIC CHOLECYSTECTOMY;  Surgeon: Ralene Ok, MD;  Location: [place];  Service: General;  Laterality: N/A;  . COLONOSCOPY    . POLYPECTOMY    . TRIGGER FINGER RELEASE      There were no vitals filed for this visit.   Subjective Assessment - 02/12/20 1148    Subjective Patient reports her knee is doing really wel. She reports the pain is good butshe does report a 7/10 at this time. She has been working on her stretches and exercises at home.    Pertinent History recent gall bladder removal, hip bursitis, 2x back surgeries,    Limitations Standing;Walking    How long can you sit comfortably? stiffens at times    How long can you stand comfortably? can stand to do dishes    How long can you walk comfortably? feels better when she is walking    Diagnostic tests nothing post-op    Patient Stated Goals to return to active lifestyle    Currently in Pain? Yes    Pain Location Knee    Pain Orientation Right     Pain Descriptors / Indicators Aching    Pain Type Chronic pain    Pain Onset More than a month ago    Pain Frequency Constant    Aggravating Factors  night knee    Pain Relieving Factors massgae, ice pack    Effect of Pain on Daily Activities walking with cane                             [facility] Adult PT Treatment/Exercise - 02/13/20 0001      Knee/Hip Exercises: Standing   Heel Raises Limitations x20    Hip Flexion Limitations Standing slow march. Advised patient to advance to single leg stance with UE gaurding when it feels stable    Lateral Step Up 2 sets;10 reps;Hand Hold: 1;Step Height: 4"    Forward Step Up 2 sets;10 reps;Step Height: 4";Hand Hold: 2    Functional Squat Limitations x20      Manual Therapy   Manual Therapy Soft tissue mobilization;Joint mobilization    Joint Mobilization PA and AP mobilization to improve extension    Soft tissue mobilization to posterior loeg  PT Education - 02/12/20 1205    Education Details reviewed extension stretching    Person(s) Educated Patient    Methods Explanation;Demonstration;Tactile cues;Verbal cues    Comprehension Verbalized understanding;Returned demonstration;Verbal cues required;Tactile cues required            PT Short Term Goals - 01/28/20 1305      PT SHORT TERM GOAL #1   Title Patient will be independent with basic HEP    Time 3    Period Weeks    Status New    Target Date 02/18/20      PT SHORT TERM GOAL #2   Title Therapy will review FOTO score    Time 1    Period Weeks    Status New    Target Date 02/04/20      PT SHORT TERM GOAL #3   Title Patient will increase gross right LE strength to 4+/5    Time 3    Period Weeks    Status New    Target Date 02/18/20      PT SHORT TERM GOAL #4   Title Patient will increase tolat arc of motion to 3-115    Time 3    Period Weeks    Status New    Target Date 02/18/20             PT Long Term Goals -  01/28/20 1331      PT LONG TERM GOAL #1   Title Patient will go up and down 8 stpes with recipracal pattern    Time 6    Period Weeks    Status New    Target Date 03/10/20      PT LONG TERM GOAL #2   Title Patient will stand and ambualte 3000' without a device without pain in order to go shopping    Time 6    Period Weeks    Status New    Target Date 03/11/20                 Plan - 02/13/20 1001    Clinical Impression Statement Patient is doing very well working on her exercises at home. Her range conitnues to improve. She had a total arc of 3-110 today. She has pain with extension stretching. She was advised at this time this is the most improtant thing she does at home is extension stretching. She will be doing a lot of trabveling ofver the next few weeks. She was given a hamstirng stretch for home and when she gets done driving places. Her strength and stability in standing are improving. She is using the can very little at home. We intitated stair training at home. [facility] She will work on her exercises for 2-3 weeks.    Personal Factors and Comorbidities Comorbidity 1;Comorbidity 3+;Comorbidity 2    Comorbidities right ip bursitiss, mulitple back surgerys, recent gallblader removal    Examination-Activity Limitations Locomotion Level;Sit;Squat;Dressing;Lift;Stand    Examination-Participation Restrictions Cleaning;Community Activity;Laundry;Shop    Stability/Clinical Decision Making Stable/Uncomplicated    Clinical Decision Making Low    Rehab Potential Excellent    PT Frequency 2x / week    PT Duration 8 weeks    PT Treatment/Interventions ADLs/Self Care Home Management;Electrical Stimulation;Cryotherapy;Iontophoresis 4mg /ml Dexamethasone;Ultrasound;DME Instruction;Gait training;Stair training;Therapeutic activities;Therapeutic exercise;Neuromuscular re-education;Patient/family education;Manual techniques;Passive range of  motion;Splinting    PT Next Visit Plan Patient reports her biggest difficulty is staris. Work on progressive stair training; review  exercises; work on extension stretching. Limited review of HEP 2nd to patient being 15 minutes late. Progress standing exercises as tolerated. gait training without the cane as appropriate    PT Home Exercise Plan reviewe self extension stretch    Consulted and Agree with Plan of Care Patient           Patient will benefit from skilled therapeutic intervention in order to improve the following deficits and impairments:  Abnormal gait,Decreased range of motion,Difficulty walking,Decreased endurance,Decreased activity tolerance,Pain,Decreased balance,Decreased mobility,Decreased strength  Visit Diagnosis: Acute pain of right knee  Stiffness of right knee, not elsewhere classified  Other abnormalities of gait and mobility  Localized edema     Problem List Patient Active Problem List   Diagnosis Date Noted  . Cholelithiasis 12/22/2019  . RUQ abdominal pain 12/20/2019  . Hypertension 03/15/2011  . Obesity, Class II, BMI 35-39.9, with comorbidity 03/15/2011  . Chronic back pain     Carney Living PT DPT  02/13/2020, 10:24 AM  [facility] [address] [place], [place], [address] Phone: [phone]   Fax:  [phone]  Name: Raven Mclean MRN: [id number] Date of Birth: Apr 10, 1955

## 2020-02-27 NOTE — Therapy (Signed)
[place], [place], [address] Phone: [phone]   Fax:  [phone]  Physical Therapy Treatment  Patient Details  Name: Raven Mclean MRN: [id number] Date of Birth: 12/12/1955 Referring Provider (PT): Dr Vara Guardian   Encounter Date: 01/28/2020    Past Medical History:  Diagnosis Date  . Arthritis   . Blood transfusion   . Chronic back pain   . Hyperlipidemia   . Hypertension   . Neuromuscular disorder (HCC)    spasms in legs    Past Surgical History:  Procedure Laterality Date  . BACK SURGERY    . CESAREAN SECTION    . CHOLECYSTECTOMY N/A 12/20/2019   Procedure: LAPAROSCOPIC CHOLECYSTECTOMY;  Surgeon: Ralene Ok, MD;  Location: [place];  Service: General;  Laterality: N/A;  . COLONOSCOPY    . POLYPECTOMY    . TRIGGER FINGER RELEASE      There were no vitals filed for this visit.                                PT Short Term Goals - 01/28/20 1305      PT SHORT TERM GOAL #1   Title Patient will be independent with basic HEP    Time 3    Period Weeks    Status New    Target Date 02/18/20      PT SHORT TERM GOAL #2   Title Therapy will review FOTO score    Time 1    Period Weeks    Status New    Target Date 02/04/20      PT SHORT TERM GOAL #3   Title Patient will increase gross right LE strength to 4+/5    Time 3    Period Weeks    Status New    Target Date 02/18/20      PT SHORT TERM GOAL #4   Title Patient will increase tolat arc of motion to 3-115    Time 3    Period Weeks    Status New    Target Date 02/18/20             PT Long Term Goals - 01/28/20 1331      PT LONG TERM GOAL #1   Title Patient will go up and down 8 stpes with recipracal pattern    Time 6    Period Weeks    Status New    Target Date 03/10/20      PT LONG TERM GOAL #2   Title Patient will stand and ambualte 3000' without a device without pain in order to  go shopping    Time 6    Period Weeks    Status New    Target Date 03/11/20                  Patient will benefit from skilled therapeutic intervention in order to improve the following deficits and impairments:  Abnormal gait,Decreased range of motion,Difficulty walking,Decreased endurance,Decreased activity tolerance,Pain,Decreased balance,Decreased mobility,Decreased strength  Visit Diagnosis: Acute pain of right knee - Plan: PT plan of care cert/re-cert  Stiffness of right knee, not elsewhere classified - Plan: PT plan of care cert/re-cert  Other abnormalities of gait and mobility - Plan: PT plan of care cert/re-cert  Localized edema - Plan: PT plan of care cert/re-cert     Problem List Patient Active Problem List   Diagnosis Date  Noted  . Cholelithiasis 12/22/2019  . RUQ abdominal pain 12/20/2019  . Hypertension 03/15/2011  . Obesity, Class II, BMI 35-39.9, with comorbidity 03/15/2011  . Chronic back pain     Carney Living 02/27/2020, 2:39 PM  [facility] [address] [place], [place], [address] Phone: [phone]   Fax:  [phone]  Name: Raven Mclean MRN: [id number] Date of Birth: Aug 09, 1955

## 2020-03-04 ENCOUNTER — Ambulatory Visit: Payer: Medicare HMO

## 2020-03-11 ENCOUNTER — Ambulatory Visit: Payer: Medicare HMO | Attending: Internal Medicine | Admitting: Physical Therapy

## 2020-03-11 ENCOUNTER — Other Ambulatory Visit: Payer: Self-pay

## 2020-03-11 ENCOUNTER — Encounter: Payer: Self-pay | Admitting: Physical Therapy

## 2020-03-11 DIAGNOSIS — R600 Localized edema: Secondary | ICD-10-CM | POA: Insufficient documentation

## 2020-03-11 DIAGNOSIS — M25661 Stiffness of right knee, not elsewhere classified: Secondary | ICD-10-CM | POA: Diagnosis present

## 2020-03-11 DIAGNOSIS — R2689 Other abnormalities of gait and mobility: Secondary | ICD-10-CM | POA: Diagnosis present

## 2020-03-11 DIAGNOSIS — M25561 Pain in right knee: Secondary | ICD-10-CM | POA: Insufficient documentation

## 2020-03-12 ENCOUNTER — Other Ambulatory Visit: Payer: Self-pay | Admitting: Internal Medicine

## 2020-03-12 ENCOUNTER — Encounter: Payer: Self-pay | Admitting: Physical Therapy

## 2020-03-12 DIAGNOSIS — Z1231 Encounter for screening mammogram for malignant neoplasm of breast: Secondary | ICD-10-CM

## 2020-03-12 NOTE — Therapy (Signed)
[place], [place], [address] Phone: [phone]   Fax:  [phone]  Physical Therapy Treatment  Patient Details  Name: Raven Mclean MRN: [id number] Date of Birth: 02-13-1955 Referring Provider (PT): Dr Vara Guardian   Encounter Date: 03/11/2020   PT End of Session - 03/11/20 1347    Visit Number 3    Number of Visits 16    Date for PT Re-Evaluation 03/25/20    Authorization Type [employer]    PT Start Time 1338    PT Stop Time 1416    PT Time Calculation (min) 38 min    Activity Tolerance Patient tolerated treatment well    Behavior During Therapy [facility] for tasks assessed/performed           Past Medical History:  Diagnosis Date  . Arthritis   . Blood transfusion   . Chronic back pain   . Hyperlipidemia   . Hypertension   . Neuromuscular disorder (HCC)    spasms in legs    Past Surgical History:  Procedure Laterality Date  . BACK SURGERY    . CESAREAN SECTION    . CHOLECYSTECTOMY N/A 12/20/2019   Procedure: LAPAROSCOPIC CHOLECYSTECTOMY;  Surgeon: Ralene Ok, MD;  Location: [place];  Service: General;  Laterality: N/A;  . COLONOSCOPY    . POLYPECTOMY    . TRIGGER FINGER RELEASE      There were no vitals filed for this visit.   Subjective Assessment - 03/11/20 1342    Subjective Patient continues to make progress. She reports she ihas been to the MD. He wants her to continue with her cane. She is a little sore from standing in the cold.    Pertinent History recent gall bladder removal, hip bursitis, 2x back surgeries,    Limitations Standing;Walking    How long can you sit comfortably? stiffens at times    How long can you stand comfortably? can stand to do dishes    How long can you walk comfortably? feels better when she is walking    Diagnostic tests nothing post-op    Patient Stated Goals to return to active lifestyle    Currently in Pain? No/denies   just a little soreness.                             [facility] Adult PT Treatment/Exercise - 03/12/20 0001      Knee/Hip Exercises: Standing   Heel Raises Limitations x20    Hip Flexion Limitations Standing slow march. Advised patient to advance to single leg stance with UE gaurding when it feels stable    Lateral Step Up 2 sets;10 reps;Hand Hold: 1;Step Height: 4"    Forward Step Up 2 sets;10 reps;Step Height: 4";Hand Hold: 2    Functional Squat Limitations x20      Knee/Hip Exercises: Supine   Bridges Limitations 2x10    Straight Leg Raises Limitations 2x10      Manual Therapy   Joint Mobilization PA and AP mobilization to improve extension; reviewed patella mobbilization.    Soft tissue mobilization to hamstring                  PT Education - 03/11/20 1346    Education Details reviewed expected porgress at this time.    Person(s) Educated Patient    Methods Explanation;Demonstration;Tactile cues;Verbal cues    Comprehension Verbalized understanding;Returned demonstration;Verbal cues required;Tactile cues required  PT Short Term Goals - 01/28/20 1305      PT SHORT TERM GOAL #1   Title Patient will be independent with basic HEP    Time 3    Period Weeks    Status New    Target Date 02/18/20      PT SHORT TERM GOAL #2   Title Therapy will review FOTO score    Time 1    Period Weeks    Status New    Target Date 02/04/20      PT SHORT TERM GOAL #3   Title Patient will increase gross right LE strength to 4+/5    Time 3    Period Weeks    Status New    Target Date 02/18/20      PT SHORT TERM GOAL #4   Title Patient will increase tolat arc of motion to 3-115    Time 3    Period Weeks    Status New    Target Date 02/18/20             PT Long Term Goals - 01/28/20 1331      PT LONG TERM GOAL #1   Title Patient will go up and down 8 stpes with recipracal pattern    Time 6    Period Weeks    Status New    Target Date 03/10/20      PT LONG TERM  GOAL #2   Title Patient will stand and ambualte 3000' without a device without pain in order to go shopping    Time 6    Period Weeks    Status New    Target Date 03/11/20                 Plan - 03/12/20 1318    Clinical Impression Statement Patient continues to make great progress. She has perfroming the majority of her rehab at home, with a check in here ewvery ew weeks. So far she is porgressing as we would hope and expect. Her flexion has improved to 120. Her extension is still slightly limited. She was advised to continue to focus on her extension. She is al    Comorbidities right ip bursitiss, mulitple back surgerys, recent gallblader removal    Examination-Activity Limitations Locomotion Level;Sit;Squat;Dressing;Lift;Stand    Examination-Participation Restrictions Cleaning;Community Activity;Laundry;Shop    Stability/Clinical Decision Making Stable/Uncomplicated    Rehab Potential Excellent    PT Frequency 2x / week    PT Duration 8 weeks    PT Treatment/Interventions ADLs/Self Care Home Management;Electrical Stimulation;Cryotherapy;Iontophoresis 4mg /ml Dexamethasone;Ultrasound;DME Instruction;Gait training;Stair training;Therapeutic activities;Therapeutic exercise;Neuromuscular re-education;Patient/family education;Manual techniques;Passive range of motion;Splinting    PT Next Visit Plan consider FOTO next visit. Review POC going forward. continue to progress standing exercises.    PT Home Exercise Plan reviewe self extension stretch    Consulted and Agree with Plan of Care Patient           Patient will benefit from skilled therapeutic intervention in order to improve the following deficits and impairments:  Abnormal gait,Decreased range of motion,Difficulty walking,Decreased endurance,Decreased activity tolerance,Pain,Decreased balance,Decreased mobility,Decreased strength  Visit Diagnosis: Acute pain of right knee  Stiffness of right knee, not elsewhere  classified  Other abnormalities of gait and mobility  Localized edema     Problem List Patient Active Problem List   Diagnosis Date Noted  . Cholelithiasis 12/22/2019  . RUQ abdominal pain 12/20/2019  . Hypertension 03/15/2011  . Obesity, Class II, BMI 35-39.9, with comorbidity 03/15/2011  .  Chronic back pain     Carney Living 03/12/2020, 1:55 PM  [facility] [address] [place], [place], [address] Phone: [phone]   Fax:  [phone]  Name: AMBRY DIX MRN: [id number] Date of Birth: Dec 17, 1955

## 2020-03-18 ENCOUNTER — Encounter: Payer: Self-pay | Admitting: Physical Therapy

## 2020-03-18 ENCOUNTER — Ambulatory Visit: Payer: Medicare HMO | Admitting: Physical Therapy

## 2020-03-18 ENCOUNTER — Other Ambulatory Visit: Payer: Self-pay

## 2020-03-18 DIAGNOSIS — R2689 Other abnormalities of gait and mobility: Secondary | ICD-10-CM

## 2020-03-18 DIAGNOSIS — M25661 Stiffness of right knee, not elsewhere classified: Secondary | ICD-10-CM

## 2020-03-18 DIAGNOSIS — M25561 Pain in right knee: Secondary | ICD-10-CM | POA: Diagnosis not present

## 2020-03-18 DIAGNOSIS — R600 Localized edema: Secondary | ICD-10-CM

## 2020-03-18 NOTE — Therapy (Signed)
[place], [place], [address] Phone: [phone]   Fax:  [phone]  Physical Therapy Treatment/Discharge   Patient Details  Name: Raven Mclean MRN: [id number] Date of Birth: 1955/12/04 Referring Provider (PT): Dr Vara Guardian   Encounter Date: 03/18/2020   PT End of Session - 03/18/20 1109    Visit Number 4    Number of Visits 16    Date for PT Re-Evaluation 03/25/20    PT Start Time 1100    PT Stop Time 1140    PT Time Calculation (min) 40 min    Activity Tolerance Patient tolerated treatment well    Behavior During Therapy [facility] for tasks assessed/performed           Past Medical History:  Diagnosis Date  . Arthritis   . Blood transfusion   . Chronic back pain   . Hyperlipidemia   . Hypertension   . Neuromuscular disorder (HCC)    spasms in legs    Past Surgical History:  Procedure Laterality Date  . BACK SURGERY    . CESAREAN SECTION    . CHOLECYSTECTOMY N/A 12/20/2019   Procedure: LAPAROSCOPIC CHOLECYSTECTOMY;  Surgeon: Ralene Ok, MD;  Location: [place];  Service: General;  Laterality: N/A;  . COLONOSCOPY    . POLYPECTOMY    . TRIGGER FINGER RELEASE      There were no vitals filed for this visit.   Subjective Assessment - 03/18/20 1123    Subjective Patient is making good progress. She has been working hard at home on her exercises> She feels comfortable with her HEP.    Pertinent History recent gall bladder removal, hip bursitis, 2x back surgeries,    Limitations Standing;Walking    How long can you sit comfortably? stiffens at times    How long can you stand comfortably? can stand to do dishes    How long can you walk comfortably? feels better when she is walking    Diagnostic tests nothing post-op    Patient Stated Goals to return to active lifestyle    Currently in Pain? No/denies              [facility] PT Assessment - 03/18/20 0001      Observation/Other  Assessments   Focus on Therapeutic Outcomes (FOTO)  31# limitation      AROM   Right Knee Extension 0    Right Knee Flexion 114                         [facility] Adult PT Treatment/Exercise - 03/18/20 0001      Self-Care   Self-Care Other Self-Care Comments    Other Self-Care Comments  reviewed symptom mangement; reviewed basivc HEP if she gets flaired up. Reviewed all exercise sand how to progress. Reviewed ROM goals      Knee/Hip Exercises: Standing   Heel Raises Limitations x20    Hip Flexion Limitations Standing slow march. Advised patient to advance to single leg stance with UE gaurding when it feels stable    Lateral Step Up 2 sets;10 reps;Hand Hold: 1;Step Height: 4"    Forward Step Up 2 sets;10 reps;Step Height: 4";Hand Hold: 2    Functional Squat Limitations x20      Knee/Hip Exercises: Supine   Short Arc Quad Sets Limitations x20 2lb    Bridges Limitations 2x10    Straight Leg Raises Limitations 2x10 2lb      Manual  Therapy   Joint Mobilization PA and AP mobilization to improve extension; reviewed patella mobbilization.    Soft tissue mobilization to hamstring                  PT Education - 03/18/20 1126    Education Details reviewed final HEP    Person(s) Educated Patient    Methods Explanation;Demonstration;Tactile cues;Verbal cues    Comprehension Verbalized understanding;Returned demonstration;Tactile cues required;Verbal cues required            PT Short Term Goals - 03/18/20 1540      PT SHORT TERM GOAL #1   Title Patient will be independent with basic HEP    Time 3    Period Weeks    Status Achieved    Target Date 02/18/20      PT SHORT TERM GOAL #2   Title Therapy will review FOTO score    Baseline reviewed    Time 3    Period Weeks    Status Achieved      PT SHORT TERM GOAL #3   Title Patient will increase gross right LE strength to 4+/5    Baseline 5/5 gross    Time 3    Period Weeks    Status Achieved      PT  SHORT TERM GOAL #4   Title Patient will increase tolat arc of motion to 3-115    Baseline 0-114 114 within margin of error    Time 3    Period Weeks    Status Achieved    Target Date 02/18/20             PT Long Term Goals - 03/18/20 1541      PT LONG TERM GOAL #1   Title Patient will go up and down 8 stpes with recipracal pattern                 Plan - 03/18/20 1536    Clinical Impression Statement Patient feels comfortable with her home program. her total arc was measured at 0--114 degrees. She is porgressing well mostly with her home program. She has a good idea of what she fneeds to do at home. She is only using her cane per MD reccomendation. She has no need for further skilled therapy. D/C to HEP at this time.    Personal Factors and Comorbidities Comorbidity 1;Comorbidity 3+;Comorbidity 2    Comorbidities right ip bursitiss, mulitple back surgerys, recent gallblader removal    Examination-Activity Limitations Locomotion Level;Sit;Squat;Dressing;Lift;Stand    Examination-Participation Restrictions Cleaning;Community Activity;Laundry;Shop    Stability/Clinical Decision Making Stable/Uncomplicated    Clinical Decision Making Low    Rehab Potential Excellent    PT Frequency 2x / week    PT Duration 8 weeks    PT Treatment/Interventions ADLs/Self Care Home Management;Electrical Stimulation;Cryotherapy;Iontophoresis 36m/ml Dexamethasone;Ultrasound;DME Instruction;Gait training;Stair training;Therapeutic activities;Therapeutic exercise;Neuromuscular re-education;Patient/family education;Manual techniques;Passive range of motion;Splinting    PT Next Visit Plan consider FOTO next visit. Review POC going forward. continue to progress standing exercises.    PT Home Exercise Plan reviewe self extension stretch    Consulted and Agree with Plan of Care Patient           Patient will benefit from skilled therapeutic intervention in order to improve the following deficits and  impairments:  Abnormal gait,Decreased range of motion,Difficulty walking,Decreased endurance,Decreased activity tolerance,Pain,Decreased balance,Decreased mobility,Decreased strength  Visit Diagnosis: Acute pain of right knee  Stiffness of right knee, not elsewhere classified  Other abnormalities of gait and  mobility  Localized edema    PHYSICAL THERAPY DISCHARGE SUMMARY  Visits from Start of Care: 4  Current functional level related to goals / functional outcomes: Improved motion, pain, and improved ability to perfrom funtional tasks    Remaining deficits: Pain at times   Education / Equipment: HEP   Plan: Patient agrees to discharge.  Patient goals were met. Patient is being discharged due to meeting the stated rehab goals.  ?????      Problem List Patient Active Problem List   Diagnosis Date Noted  . Cholelithiasis 12/22/2019  . RUQ abdominal pain 12/20/2019  . Hypertension 03/15/2011  . Obesity, Class II, BMI 35-39.9, with comorbidity 03/15/2011  . Chronic back pain     Carney Living 03/18/2020, 3:42 PM  [facility] [address] [place], [place], [address] Phone: [phone]   Fax:  [phone]  Name: IRISHA GRANDMAISON MRN: [id number] Date of Birth: [birth year]

## 2020-03-24 ENCOUNTER — Other Ambulatory Visit: Payer: Self-pay

## 2020-03-24 ENCOUNTER — Ambulatory Visit
Admission: RE | Admit: 2020-03-24 | Discharge: 2020-03-24 | Disposition: A | Discharge status: Home / Self Care | Payer: Medicare HMO | Source: Ambulatory Visit | Attending: Internal Medicine | Admitting: Internal Medicine

## 2020-03-24 DIAGNOSIS — Z1231 Encounter for screening mammogram for malignant neoplasm of breast: Secondary | ICD-10-CM

## 2020-03-25 ENCOUNTER — Ambulatory Visit: Payer: Medicare HMO | Admitting: Physical Therapy

## 2020-11-03 ENCOUNTER — Other Ambulatory Visit: Payer: Self-pay | Admitting: Internal Medicine

## 2020-11-03 DIAGNOSIS — E2839 Other primary ovarian failure: Secondary | ICD-10-CM

## 2020-11-11 ENCOUNTER — Ambulatory Visit
Admission: RE | Admit: 2020-11-11 | Discharge: 2020-11-11 | Disposition: A | Discharge status: Home / Self Care | Payer: Medicare HMO | Source: Ambulatory Visit | Attending: Internal Medicine | Admitting: Internal Medicine

## 2020-11-11 ENCOUNTER — Other Ambulatory Visit: Payer: Self-pay

## 2020-11-11 DIAGNOSIS — E2839 Other primary ovarian failure: Secondary | ICD-10-CM

## 2021-03-09 ENCOUNTER — Other Ambulatory Visit: Payer: Self-pay | Admitting: Internal Medicine

## 2021-03-09 DIAGNOSIS — Z1231 Encounter for screening mammogram for malignant neoplasm of breast: Secondary | ICD-10-CM

## 2021-03-25 ENCOUNTER — Ambulatory Visit
Admission: RE | Admit: 2021-03-25 | Discharge: 2021-03-25 | Disposition: A | Discharge status: Home / Self Care | Payer: Medicare HMO | Source: Ambulatory Visit | Attending: Internal Medicine | Admitting: Internal Medicine

## 2021-03-25 DIAGNOSIS — Z1231 Encounter for screening mammogram for malignant neoplasm of breast: Secondary | ICD-10-CM

## 2021-04-05 ENCOUNTER — Encounter: Payer: Self-pay | Admitting: Gastroenterology

## 2021-05-26 ENCOUNTER — Other Ambulatory Visit: Payer: Self-pay | Admitting: Internal Medicine

## 2021-05-27 LAB — CBC
HCT: 37.5 % (ref 35.0–45.0)
Hemoglobin: 12.7 g/dL (ref 11.7–15.5)
MCH: 33.3 pg — ABNORMAL HIGH (ref 27.0–33.0)
MCHC: 33.9 g/dL (ref 32.0–36.0)
MCV: 98.4 fL (ref 80.0–100.0)
MPV: 12 fL (ref 7.5–12.5)
Platelets: 239 10*3/uL (ref 140–400)
RBC: 3.81 10*6/uL (ref 3.80–5.10)
RDW: 11.8 % (ref 11.0–15.0)
WBC: 6.9 10*3/uL (ref 3.8–10.8)

## 2021-05-27 LAB — COMPLETE METABOLIC PANEL WITH GFR
AG Ratio: 1.6 (calc) (ref 1.0–2.5)
ALT: 5 U/L — ABNORMAL LOW (ref 6–29)
AST: 15 U/L (ref 10–35)
Albumin: 4.2 g/dL (ref 3.6–5.1)
Alkaline phosphatase (APISO): 72 U/L (ref 37–153)
BUN: 14 mg/dL (ref 7–25)
CO2: 27 mmol/L (ref 20–32)
Calcium: 9.7 mg/dL (ref 8.6–10.4)
Chloride: 100 mmol/L (ref 98–110)
Creat: 1.04 mg/dL (ref 0.50–1.05)
Globulin: 2.7 g/dL (calc) (ref 1.9–3.7)
Glucose, Bld: 98 mg/dL (ref 65–99)
Potassium: 4.3 mmol/L (ref 3.5–5.3)
Sodium: 139 mmol/L (ref 135–146)
Total Bilirubin: 0.3 mg/dL (ref 0.2–1.2)
Total Protein: 6.9 g/dL (ref 6.1–8.1)
eGFR: 59 mL/min/{1.73_m2} — ABNORMAL LOW (ref >=60)

## 2021-05-27 LAB — LIPID PANEL
Cholesterol: 238 mg/dL — ABNORMAL HIGH (ref <200)
HDL: 64 mg/dL (ref >=50)
LDL Cholesterol (Calc): 140 mg/dL (calc) — ABNORMAL HIGH
Non-HDL Cholesterol (Calc): 174 mg/dL (calc) — ABNORMAL HIGH (ref <130)
Total CHOL/HDL Ratio: 3.7 (calc) (ref <5.0)
Triglycerides: 197 mg/dL — ABNORMAL HIGH (ref <150)

## 2021-05-27 LAB — VITAMIN D 25 HYDROXY (VIT D DEFICIENCY, FRACTURES): Vit D, 25-Hydroxy: 73 ng/mL (ref 30–100)

## 2021-05-27 LAB — TSH: TSH: 1.64 mIU/L (ref 0.40–4.50)

## 2021-07-05 ENCOUNTER — Encounter (HOSPITAL_BASED_OUTPATIENT_CLINIC_OR_DEPARTMENT_OTHER): Payer: Self-pay | Admitting: Emergency Medicine

## 2021-07-05 ENCOUNTER — Emergency Department (HOSPITAL_BASED_OUTPATIENT_CLINIC_OR_DEPARTMENT_OTHER): Admission: EM | Admit: 2021-07-05 | Payer: Medicare HMO | Source: Home / Self Care

## 2021-07-05 ENCOUNTER — Other Ambulatory Visit: Payer: Self-pay

## 2021-07-08 DIAGNOSIS — I10 Essential (primary) hypertension: Secondary | ICD-10-CM | POA: Diagnosis not present

## 2021-07-08 DIAGNOSIS — E7849 Other hyperlipidemia: Secondary | ICD-10-CM | POA: Diagnosis not present

## 2021-07-08 DIAGNOSIS — E669 Obesity, unspecified: Secondary | ICD-10-CM | POA: Diagnosis not present

## 2021-07-08 DIAGNOSIS — Z6835 Body mass index (BMI) 35.0-35.9, adult: Secondary | ICD-10-CM | POA: Diagnosis not present

## 2021-07-08 DIAGNOSIS — M179 Osteoarthritis of knee, unspecified: Secondary | ICD-10-CM | POA: Diagnosis not present

## 2021-08-04 DIAGNOSIS — Z6832 Body mass index (BMI) 32.0-32.9, adult: Secondary | ICD-10-CM | POA: Diagnosis not present

## 2021-08-04 DIAGNOSIS — E876 Hypokalemia: Secondary | ICD-10-CM | POA: Diagnosis not present

## 2021-08-04 DIAGNOSIS — E669 Obesity, unspecified: Secondary | ICD-10-CM | POA: Diagnosis not present

## 2021-08-04 DIAGNOSIS — R030 Elevated blood-pressure reading, without diagnosis of hypertension: Secondary | ICD-10-CM | POA: Diagnosis not present

## 2021-08-04 DIAGNOSIS — M1990 Unspecified osteoarthritis, unspecified site: Secondary | ICD-10-CM | POA: Diagnosis not present

## 2021-08-04 DIAGNOSIS — E785 Hyperlipidemia, unspecified: Secondary | ICD-10-CM | POA: Diagnosis not present

## 2021-08-10 DIAGNOSIS — I10 Essential (primary) hypertension: Secondary | ICD-10-CM | POA: Diagnosis not present

## 2021-08-10 DIAGNOSIS — Z6834 Body mass index (BMI) 34.0-34.9, adult: Secondary | ICD-10-CM | POA: Diagnosis not present

## 2021-08-10 DIAGNOSIS — M179 Osteoarthritis of knee, unspecified: Secondary | ICD-10-CM | POA: Diagnosis not present

## 2021-08-10 DIAGNOSIS — E2839 Other primary ovarian failure: Secondary | ICD-10-CM | POA: Diagnosis not present

## 2021-08-10 DIAGNOSIS — E7849 Other hyperlipidemia: Secondary | ICD-10-CM | POA: Diagnosis not present

## 2021-08-10 DIAGNOSIS — E669 Obesity, unspecified: Secondary | ICD-10-CM | POA: Diagnosis not present

## 2021-08-12 ENCOUNTER — Other Ambulatory Visit: Payer: Self-pay | Admitting: Internal Medicine

## 2021-08-12 DIAGNOSIS — E2839 Other primary ovarian failure: Secondary | ICD-10-CM

## 2021-10-26 DIAGNOSIS — M179 Osteoarthritis of knee, unspecified: Secondary | ICD-10-CM | POA: Diagnosis not present

## 2021-10-26 DIAGNOSIS — Z23 Encounter for immunization: Secondary | ICD-10-CM | POA: Diagnosis not present

## 2021-10-26 DIAGNOSIS — I10 Essential (primary) hypertension: Secondary | ICD-10-CM | POA: Diagnosis not present

## 2021-10-26 DIAGNOSIS — M7072 Other bursitis of hip, left hip: Secondary | ICD-10-CM | POA: Diagnosis not present

## 2021-10-26 DIAGNOSIS — E669 Obesity, unspecified: Secondary | ICD-10-CM | POA: Diagnosis not present

## 2021-10-26 DIAGNOSIS — E7849 Other hyperlipidemia: Secondary | ICD-10-CM | POA: Diagnosis not present

## 2021-10-29 DIAGNOSIS — M25552 Pain in left hip: Secondary | ICD-10-CM | POA: Diagnosis not present

## 2021-11-23 DIAGNOSIS — M7062 Trochanteric bursitis, left hip: Secondary | ICD-10-CM | POA: Diagnosis not present

## 2021-11-30 DIAGNOSIS — M160 Bilateral primary osteoarthritis of hip: Secondary | ICD-10-CM | POA: Diagnosis not present

## 2021-11-30 DIAGNOSIS — Z09 Encounter for follow-up examination after completed treatment for conditions other than malignant neoplasm: Secondary | ICD-10-CM | POA: Diagnosis not present

## 2021-11-30 DIAGNOSIS — Z96651 Presence of right artificial knee joint: Secondary | ICD-10-CM | POA: Diagnosis not present

## 2021-11-30 DIAGNOSIS — M25552 Pain in left hip: Secondary | ICD-10-CM | POA: Diagnosis not present

## 2021-11-30 DIAGNOSIS — M7062 Trochanteric bursitis, left hip: Secondary | ICD-10-CM | POA: Diagnosis not present

## 2021-11-30 DIAGNOSIS — M47816 Spondylosis without myelopathy or radiculopathy, lumbar region: Secondary | ICD-10-CM | POA: Diagnosis not present

## 2021-11-30 DIAGNOSIS — M47897 Other spondylosis, lumbosacral region: Secondary | ICD-10-CM | POA: Diagnosis not present

## 2021-12-15 ENCOUNTER — Other Ambulatory Visit: Payer: Self-pay | Admitting: Internal Medicine

## 2021-12-15 DIAGNOSIS — E7849 Other hyperlipidemia: Secondary | ICD-10-CM | POA: Diagnosis not present

## 2021-12-15 DIAGNOSIS — M179 Osteoarthritis of knee, unspecified: Secondary | ICD-10-CM | POA: Diagnosis not present

## 2021-12-15 DIAGNOSIS — Z124 Encounter for screening for malignant neoplasm of cervix: Secondary | ICD-10-CM | POA: Diagnosis not present

## 2021-12-15 DIAGNOSIS — I10 Essential (primary) hypertension: Secondary | ICD-10-CM | POA: Diagnosis not present

## 2021-12-15 DIAGNOSIS — N760 Acute vaginitis: Secondary | ICD-10-CM | POA: Diagnosis not present

## 2021-12-16 LAB — C. TRACHOMATIS/N. GONORRHOEAE RNA
C. trachomatis RNA, TMA: NOT DETECTED
N. gonorrhoeae RNA, TMA: NOT DETECTED

## 2022-01-13 DIAGNOSIS — E669 Obesity, unspecified: Secondary | ICD-10-CM | POA: Diagnosis not present

## 2022-01-13 DIAGNOSIS — E7849 Other hyperlipidemia: Secondary | ICD-10-CM | POA: Diagnosis not present

## 2022-01-13 DIAGNOSIS — M179 Osteoarthritis of knee, unspecified: Secondary | ICD-10-CM | POA: Diagnosis not present

## 2022-01-13 DIAGNOSIS — I10 Essential (primary) hypertension: Secondary | ICD-10-CM | POA: Diagnosis not present

## 2022-02-08 ENCOUNTER — Ambulatory Visit (INDEPENDENT_AMBULATORY_CARE_PROVIDER_SITE_OTHER): Payer: Medicare HMO | Admitting: Obstetrics

## 2022-02-08 ENCOUNTER — Other Ambulatory Visit (HOSPITAL_COMMUNITY)
Admission: RE | Admit: 2022-02-08 | Discharge: 2022-02-08 | Disposition: A | Discharge status: Home / Self Care | Payer: Medicare HMO | Source: Ambulatory Visit | Attending: Obstetrics | Admitting: Obstetrics

## 2022-02-08 ENCOUNTER — Encounter: Payer: Self-pay | Admitting: Obstetrics

## 2022-02-08 VITALS — BP 138/85 | HR 90 | Ht 63.0 in | Wt 176.0 lb

## 2022-02-08 DIAGNOSIS — Z780 Asymptomatic menopausal state: Secondary | ICD-10-CM

## 2022-02-08 DIAGNOSIS — Z719 Counseling, unspecified: Secondary | ICD-10-CM

## 2022-02-08 DIAGNOSIS — N898 Other specified noninflammatory disorders of vagina: Secondary | ICD-10-CM | POA: Insufficient documentation

## 2022-02-08 DIAGNOSIS — Z01419 Encounter for gynecological examination (general) (routine) without abnormal findings: Secondary | ICD-10-CM

## 2022-02-08 DIAGNOSIS — Z1151 Encounter for screening for human papillomavirus (HPV): Secondary | ICD-10-CM | POA: Diagnosis not present

## 2022-02-08 DIAGNOSIS — Z124 Encounter for screening for malignant neoplasm of cervix: Secondary | ICD-10-CM | POA: Diagnosis not present

## 2022-02-08 DIAGNOSIS — Z113 Encounter for screening for infections with a predominantly sexual mode of transmission: Secondary | ICD-10-CM | POA: Insufficient documentation

## 2022-02-08 NOTE — Progress Notes (Signed)
Subjective:        Raven Mclean is a 67 y.o. female here for a routine exam.  Current complaints: Vaginal discharge.    Personal health questionnaire:  Is patient Raven Mclean, have a family history of breast and/or ovarian cancer: no Is there a family history of uterine cancer diagnosed at age < 56, gastrointestinal cancer, urinary tract cancer, family member who is a Field seismologist syndrome-associated carrier: yes Is the patient overweight and hypertensive, family history of diabetes, personal history of gestational diabetes, preeclampsia or PCOS: no Is patient over 21, have PCOS,  family history of premature CHD under age 56, diabetes, smoke, have hypertension or peripheral artery disease:  no At any time, has a partner hit, kicked or otherwise hurt or frightened you?: no Over the past 2 weeks, have you felt down, depressed or hopeless?: no Over the past 2 weeks, have you felt little interest or pleasure in doing things?:no   Gynecologic History No LMP recorded. Patient is postmenopausal. Contraception: post menopausal status Last Pap: 5 years ago. Results were: normal Last mammogram: 2023. Results were: normal  Obstetric History OB History  Gravida Para Term Preterm AB Living  '3 2 1 1 1 2  '$ SAB IAB Ectopic Multiple Live Births          2    # Outcome Date GA Lbr Len/2nd Weight Sex Delivery Anes PTL Lv  3 Term 08/19/84    M CS-LTranv   LIV  2 Preterm 07/07/77   4 lb (1.814 kg) M CS-LTranv  Y LIV  1 AB             Past Medical History:  Diagnosis Date   Arthritis    Blood transfusion    Chronic back pain    Hyperlipidemia    Hypertension    Neuromuscular disorder ([place])    spasms in legs    Past Surgical History:  Procedure Laterality Date   BACK SURGERY     CESAREAN SECTION     CHOLECYSTECTOMY N/A 12/20/2019   Procedure: LAPAROSCOPIC CHOLECYSTECTOMY;  Surgeon: Ralene Ok, MD;  Location: [place];  Service: General;  Laterality: N/A;   COLONOSCOPY      POLYPECTOMY     TRIGGER FINGER RELEASE       Current Outpatient Medications:    acetaminophen (TYLENOL) 500 MG tablet, Take 1,000 mg by mouth every 8 (eight) hours as needed for mild pain, fever or headache. , Disp: , Rfl:    alum & mag hydroxide-simeth (MAALOX MAX) 400-400-40 MG/5ML suspension, Take 15 mLs by mouth every 6 (six) hours as needed for indigestion., Disp: 355 mL, Rfl: 0   cholecalciferol (VITAMIN D3) 25 MCG (1000 UNIT) tablet, Take 1 tablet (1,000 Units total) by mouth daily., Disp: 30 tablet, Rfl: 0   diclofenac Sodium (VOLTAREN) 1 % GEL, Apply 2 g topically 4 (four) times daily as needed (pain). , Disp: , Rfl:    docusate sodium (COLACE) 100 MG capsule, Take 1 capsule (100 mg total) by mouth 2 (two) times daily., Disp: 10 capsule, Rfl: 0   metoprolol tartrate (LOPRESSOR) 25 MG tablet, Take 0.5 tablets (12.5 mg total) by mouth daily., Disp: 30 tablet, Rfl: 1   Multiple Vitamins-Minerals (MULTIVITAMIN WITH MINERALS) tablet, Take 1 tablet by mouth daily., Disp: , Rfl:    ondansetron (ZOFRAN ODT) 4 MG disintegrating tablet, '4mg'$  ODT q4 hours prn nausea/vomit (Patient taking differently: Take 4 mg by mouth every 4 (four) hours as needed for nausea or vomiting.),  Disp: 10 tablet, Rfl: 0   pantoprazole (PROTONIX) 40 MG tablet, Take 1 tablet (40 mg total) by mouth daily., Disp: 30 tablet, Rfl: 0   polyethylene glycol (MIRALAX / GLYCOLAX) 17 g packet, Take 17 g by mouth 2 (two) times daily., Disp: 14 each, Rfl: 0   potassium chloride SA (KLOR-CON) 20 MEQ tablet, Take 2 tablets (40 mEq total) by mouth daily., Disp: 10 tablet, Rfl: 0   senna-docusate (SENOKOT-S) 8.6-50 MG tablet, Take 2 tablets by mouth 2 (two) times daily., Disp: 30 tablet, Rfl: 0   simvastatin (ZOCOR) 20 MG tablet, Take 1 tablet (20 mg total) by mouth daily., Disp: 30 tablet, Rfl: 0 Allergies  Allergen Reactions   Hydrocodone-Acetaminophen Itching    Social History   Tobacco Use   Smoking status: Never   Smokeless  tobacco: Never  Substance Use Topics   Alcohol use: No    Family History  Problem Relation Age of Onset   Cancer Mother    Heart disease Mother    Heart disease Brother    Cancer Maternal Aunt    Colon cancer Neg Hx       Review of Systems  Constitutional: negative for fatigue and weight loss Respiratory: negative for cough and wheezing Cardiovascular: negative for chest pain, fatigue and palpitations Gastrointestinal: negative for abdominal pain and change in bowel habits Musculoskeletal:negative for myalgias Neurological: negative for gait problems and tremors Behavioral/Psych: negative for abusive relationship, depression Endocrine: negative for temperature intolerance    Genitourinary: positive for vaginal discharge.  negative for abnormal menstrual periods, genital lesions, hot flashes, sexual problems  Integument/breast: negative for breast lump, breast tenderness, nipple discharge and skin lesion(s)    Objective:       BP 138/85 (BP Location: Left Arm)   Pulse 90   Ht '5\' 3"'$  (1.6 m)   Wt 176 lb (79.8 kg)   BMI 31.18 kg/m  General:   Alert and no distress  Skin:   no rash or abnormalities  Lungs:   clear to auscultation bilaterally  Heart:   regular rate and rhythm, S1, S2 normal, no murmur, click, rub or gallop  Breasts:   normal without suspicious masses, skin or nipple changes or axillary nodes  Abdomen:  normal findings: no organomegaly, soft, non-tender and no hernia  Pelvis:  External genitalia: normal general appearance Urinary system: urethral meatus normal and bladder without fullness, nontender Vaginal: normal without tenderness, induration or masses Cervix: normal appearance Adnexa: normal bimanual exam Uterus: anteverted and non-tender, normal size   Lab Review Urine pregnancy test Labs reviewed yes Radiologic studies reviewed yes    Assessment:    1. Encounter for gynecological examination with Papanicolaou smear of cervix Rx: - Cytology  - PAP( [place])  2. Postmenopausal - doing well  3. Vaginal discharge Rx: - Cervicovaginal ancillary only( [place])  4. Screen for STD (sexually transmitted disease) Rx: - HIV antibody (with reflex) - Hepatitis C Antibody - RPR - Hepatitis B Surface AntiGEN     Plan:    Education reviewed: calcium supplements, depression evaluation, low fat, low cholesterol diet, safe sex/STD prevention, self breast exams, and weight bearing exercise. Follow up in: 1 year.    Orders Placed This Encounter  Procedures   HIV antibody (with reflex)   Hepatitis C Antibody   RPR   Hepatitis B Surface AntiGEN    Fadil Macmaster A. Jodi Mourning MD 02/08/2022

## 2022-02-09 LAB — CERVICOVAGINAL ANCILLARY ONLY
Bacterial Vaginitis (gardnerella): POSITIVE — AB
Candida Glabrata: NEGATIVE
Candida Vaginitis: NEGATIVE
Chlamydia: NEGATIVE
Comment: NEGATIVE
Comment: NEGATIVE
Comment: NEGATIVE
Comment: NEGATIVE
Comment: NEGATIVE
Comment: NORMAL
Neisseria Gonorrhea: NEGATIVE
Trichomonas: NEGATIVE

## 2022-02-09 LAB — HEPATITIS B SURFACE ANTIGEN: Hepatitis B Surface Ag: NEGATIVE

## 2022-02-09 LAB — HIV ANTIBODY (ROUTINE TESTING W REFLEX): HIV Screen 4th Generation wRfx: NONREACTIVE

## 2022-02-09 LAB — RPR: RPR Ser Ql: NONREACTIVE

## 2022-02-09 LAB — HEPATITIS C ANTIBODY: Hep C Virus Ab: NONREACTIVE

## 2022-02-10 ENCOUNTER — Telehealth: Payer: Self-pay

## 2022-02-10 ENCOUNTER — Other Ambulatory Visit: Payer: Self-pay | Admitting: Obstetrics

## 2022-02-10 DIAGNOSIS — N760 Acute vaginitis: Secondary | ICD-10-CM

## 2022-02-10 LAB — CYTOLOGY - PAP
Adequacy: ABSENT
Comment: NEGATIVE
Diagnosis: NEGATIVE
High risk HPV: NEGATIVE

## 2022-02-10 MED ORDER — METRONIDAZOLE 500 MG PO TABS: 500.0000 mg | ORAL_TABLET | Freq: Two times a day (BID) | ORAL | 2 refills | Status: DC

## 2022-02-10 NOTE — Telephone Encounter (Signed)
S/w pt and advised of results and rx sent. 

## 2022-03-03 DIAGNOSIS — I10 Essential (primary) hypertension: Secondary | ICD-10-CM | POA: Diagnosis not present

## 2022-03-03 DIAGNOSIS — Z6832 Body mass index (BMI) 32.0-32.9, adult: Secondary | ICD-10-CM | POA: Diagnosis not present

## 2022-03-03 DIAGNOSIS — J069 Acute upper respiratory infection, unspecified: Secondary | ICD-10-CM | POA: Diagnosis not present

## 2022-03-03 DIAGNOSIS — M179 Osteoarthritis of knee, unspecified: Secondary | ICD-10-CM | POA: Diagnosis not present

## 2022-03-03 DIAGNOSIS — Z7189 Other specified counseling: Secondary | ICD-10-CM | POA: Diagnosis not present

## 2022-03-03 DIAGNOSIS — E669 Obesity, unspecified: Secondary | ICD-10-CM | POA: Diagnosis not present

## 2022-03-25 ENCOUNTER — Ambulatory Visit
Admission: RE | Admit: 2022-03-25 | Discharge: 2022-03-25 | Disposition: A | Discharge status: Home / Self Care | Payer: Medicare HMO | Source: Ambulatory Visit | Attending: Internal Medicine | Admitting: Internal Medicine

## 2022-03-25 DIAGNOSIS — Z780 Asymptomatic menopausal state: Secondary | ICD-10-CM | POA: Diagnosis not present

## 2022-03-25 DIAGNOSIS — E2839 Other primary ovarian failure: Secondary | ICD-10-CM

## 2022-03-25 DIAGNOSIS — M85851 Other specified disorders of bone density and structure, right thigh: Secondary | ICD-10-CM | POA: Diagnosis not present

## 2022-04-05 ENCOUNTER — Encounter: Payer: Self-pay | Admitting: Plastic Surgery

## 2022-04-05 ENCOUNTER — Ambulatory Visit (INDEPENDENT_AMBULATORY_CARE_PROVIDER_SITE_OTHER): Payer: Medicare HMO | Admitting: Plastic Surgery

## 2022-04-05 VITALS — BP 134/82 | HR 69 | Ht 62.0 in | Wt 177.6 lb

## 2022-04-05 DIAGNOSIS — L987 Excessive and redundant skin and subcutaneous tissue: Secondary | ICD-10-CM | POA: Diagnosis not present

## 2022-04-05 DIAGNOSIS — Z6832 Body mass index (BMI) 32.0-32.9, adult: Secondary | ICD-10-CM

## 2022-04-05 DIAGNOSIS — R21 Rash and other nonspecific skin eruption: Secondary | ICD-10-CM

## 2022-04-05 DIAGNOSIS — E65 Localized adiposity: Secondary | ICD-10-CM | POA: Diagnosis not present

## 2022-04-05 NOTE — Progress Notes (Signed)
Referring Provider Nolene Ebbs, MD Raven Mclean,  [place] [address]   CC:  Chief Complaint  Patient presents with   Advice Only      Raven Mclean is an 67 y.o. female.  HPI: Raven Mclean is a very nice 67 year old female who presents today for discussion of removal of excess skin.  Patient states that she has lost almost 50 pounds through diet and exercise and use of Ozempic.  She has had significant improvement in her blood pressure but is also started to have excess skin which causes rashes especially on the posterior aspect of the pannus.  She also has 2 areas of localized adiposity on the medial aspect of each thigh which is rubbing together and causes her discomfort.  Allergies  Allergen Reactions   Hydrocodone-Acetaminophen Itching    Outpatient Encounter Medications as of 04/05/2022  Medication Sig Note   acetaminophen (TYLENOL) 500 MG tablet Take 1,000 mg by mouth every 8 (eight) hours as needed for mild pain, fever or headache.     alum & mag hydroxide-simeth (MAALOX MAX) 400-400-40 MG/5ML suspension Take 15 mLs by mouth every 6 (six) hours as needed for indigestion. 12/21/2019: Pt has not picked up yet   cholecalciferol (VITAMIN D3) 25 MCG (1000 UNIT) tablet Take 1 tablet (1,000 Units total) by mouth daily.    diclofenac Sodium (VOLTAREN) 1 % GEL Apply 2 g topically 4 (four) times daily as needed (pain).     docusate sodium (COLACE) 100 MG capsule Take 1 capsule (100 mg total) by mouth 2 (two) times daily.    metoprolol tartrate (LOPRESSOR) 25 MG tablet Take 0.5 tablets (12.5 mg total) by mouth daily.    metoprolol-hydrochlorothiazide (LOPRESSOR HCT) 50-25 MG tablet Take 1 tablet by mouth daily.    Multiple Vitamins-Minerals (MULTIVITAMIN WITH MINERALS) tablet Take 1 tablet by mouth daily.    potassium chloride (KLOR-CON M) 10 MEQ tablet Take 10 mEq by mouth daily.    potassium chloride SA (KLOR-CON) 20 MEQ tablet Take 2 tablets (40 mEq total) by mouth daily.     simvastatin (ZOCOR) 20 MG tablet Take 1 tablet (20 mg total) by mouth daily.    [DISCONTINUED] metroNIDAZOLE (FLAGYL) 500 MG tablet Take 1 tablet (500 mg total) by mouth 2 (two) times daily. (Patient not taking: Reported on 04/05/2022)    [DISCONTINUED] ondansetron (ZOFRAN ODT) 4 MG disintegrating tablet '4mg'$  ODT q4 hours prn nausea/vomit (Patient not taking: Reported on 04/05/2022)    [DISCONTINUED] pantoprazole (PROTONIX) 40 MG tablet Take 1 tablet (40 mg total) by mouth daily. (Patient not taking: Reported on 04/05/2022)    [DISCONTINUED] polyethylene glycol (MIRALAX / GLYCOLAX) 17 g packet Take 17 g by mouth 2 (two) times daily. (Patient not taking: Reported on 04/05/2022)    [DISCONTINUED] senna-docusate (SENOKOT-S) 8.6-50 MG tablet Take 2 tablets by mouth 2 (two) times daily. (Patient not taking: Reported on 04/05/2022)    No facility-administered encounter medications on file as of 04/05/2022.     Past Medical History:  Diagnosis Date   Arthritis    Blood transfusion    Chronic back pain    Hyperlipidemia    Hypertension    Neuromuscular disorder (HCC)    spasms in legs    Past Surgical History:  Procedure Laterality Date   BACK SURGERY     CESAREAN SECTION     CHOLECYSTECTOMY N/A 12/20/2019   Procedure: LAPAROSCOPIC CHOLECYSTECTOMY;  Surgeon: Ralene Ok, MD;  Location: [facility];  Service: General;  Laterality: N/A;  COLONOSCOPY     POLYPECTOMY     TRIGGER FINGER RELEASE      Family History  Problem Relation Age of Onset   Cancer Mother    Heart disease Mother    Heart disease Brother    Cancer Maternal Aunt    Colon cancer Neg Hx     Social History   Social History Narrative   Not on file     Review of Systems General: Denies fevers, chills, weight loss CV: Denies chest pain, shortness of breath, palpitations Skin: Excess skin on the anterior abdominal wall which occasionally has rashes and excess skin and fat on the medial thighs which causes discomfort from rubbing  together  Physical Exam    04/05/2022   10:40 AM 02/08/2022    2:32 PM 02/08/2022    2:22 PM  Vitals with BMI  Height '5\' 2"'$   '5\' 3"'$   Weight 177 lbs 10 oz  176 lbs  BMI Q000111Q  [id number]  Systolic Q000111Q [id number]  Diastolic 82 85 84  Pulse 69 90 83    General:  No acute distress,  Alert and oriented, Non-Toxic, Normal speech and affect Abdomen: Patient has a moderate pannus which hangs to her symphysis pubis.  She does not have any rashes today. Thighs: The patient does have redundant skin on her medial thighs but she also has 2 discrete pockets of fat which rubbed together and there is evidence of abrasion on each 1. Mammogram: Mammogram in February 2023 was BI-RADS 1 Assessment/Plan Pannus: Patient would be a good candidate for a panniculectomy.  She understands that removal of the pannus does not address any of the fat above the umbilicus.  I showed her where the location of the incisions would be and we discussed the risks of bleeding, infection, and seroma formation.  She understands that she will have drains postoperatively and will need to wear compression. Thighs: I discussed with the patient the fact that I do not do thigh lifts and would not offer her 1.  She does though however have 2 discrete areas of localized adiposity on the medial aspect of her thighs.  These 2 areas rubbed together and appear to cause discomfort.  I would be willing to treat these areas with limited liposuction.  Understands that she would need to wear compression over the areas of liposuction to his they would be very likely to develop seromas postoperatively. Will submit for insurance consideration and we will send her a quote for the liposuction if insurance does not approve the procedure.  Raven Mclean 04/05/2022, 11:08 AM

## 2022-04-06 ENCOUNTER — Institutional Professional Consult (permissible substitution): Payer: Medicare HMO | Admitting: Plastic Surgery

## 2022-04-28 ENCOUNTER — Other Ambulatory Visit: Payer: Self-pay | Admitting: Internal Medicine

## 2022-04-28 DIAGNOSIS — Z1231 Encounter for screening mammogram for malignant neoplasm of breast: Secondary | ICD-10-CM

## 2022-05-04 ENCOUNTER — Telehealth: Payer: Self-pay | Admitting: Plastic Surgery

## 2022-05-04 NOTE — Telephone Encounter (Signed)
Pending auth from [facility] for CPT code [phone] with faxed clinicals to [phone] to [employer] for [place] Ref# [id number] Out of network for physician in network for facility for CPT code [phone].

## 2022-05-12 ENCOUNTER — Telehealth: Payer: Self-pay | Admitting: Plastic Surgery

## 2022-05-12 NOTE — Telephone Encounter (Signed)
Spoke with pt and notified of insurance approval and scheduler will be contactingher soon to set up her surgery.

## 2022-05-12 NOTE — Telephone Encounter (Signed)
Faxed approval [id number] good from 05/04/22 to 08/03/22.

## 2022-06-13 ENCOUNTER — Ambulatory Visit: Payer: Medicare HMO

## 2022-06-23 ENCOUNTER — Ambulatory Visit (INDEPENDENT_AMBULATORY_CARE_PROVIDER_SITE_OTHER): Payer: Medicare HMO | Admitting: Student

## 2022-06-23 ENCOUNTER — Other Ambulatory Visit (HOSPITAL_COMMUNITY): Payer: Self-pay

## 2022-06-23 ENCOUNTER — Encounter: Payer: Self-pay | Admitting: Student

## 2022-06-23 VITALS — BP 132/80 | HR 64 | Ht 63.0 in | Wt 185.2 lb

## 2022-06-23 DIAGNOSIS — L987 Excessive and redundant skin and subcutaneous tissue: Secondary | ICD-10-CM

## 2022-06-23 MED ORDER — ONDANSETRON HCL 4 MG PO TABS: 4.0000 mg | ORAL_TABLET | Freq: Three times a day (TID) | ORAL | 0 refills | Status: AC | PRN

## 2022-06-23 MED ORDER — OXYCODONE HCL 5 MG PO TABS: 5.0000 mg | ORAL_TABLET | Freq: Four times a day (QID) | ORAL | 0 refills | Status: AC | PRN

## 2022-06-23 MED ORDER — WEGOVY 0.25 MG/0.5ML ~~LOC~~ SOAJ
0.2500 mg | SUBCUTANEOUS | 2 refills | Status: AC
  Filled 2022-06-23: qty 2, 28d supply, fill #0

## 2022-06-23 NOTE — Progress Notes (Signed)
Patient ID: Raven Mclean, female    DOB: [birth year], 67 y.o.   MRN: [id number]  Chief Complaint  Patient presents with   Pre-op Exam      ICD-10-CM   1. Excess skin of abdominal wall  L98.7        History of Present Illness: Raven Mclean is a 67 y.o.  female  with a history of panniculitis.  She presents for preoperative evaluation for upcoming procedure, panniculectomy, scheduled for 07/08/2022 with Dr. Ladona Ridgel.  Patient reports she has had a little bit of postoperative nausea and vomiting from anesthesia.  Patient denies any history of cardiac disease.  She denies taking any blood thinners.  Patient reports she is not a smoker.  Patient denies taking any hormone replacement.  She reports history of 1 miscarriage many years ago.  She denies any history of blood clots or clotting diseases personally or in her family.  She denies any recent surgeries, traumas, infections or hospitalizations.  She denies any history of stroke or heart attack.  She denies any history of Crohn's disease, ulcerative colitis, COPD, asthma or cancer.  She denies any varicosities to her lower extremities.  She denies any recent fevers, chills or changes in her health.  Summary of Previous Visit: Patient was seen by Dr. Ladona Ridgel on 04/05/2022 for discussion of removal of excess skin.  Patient reported that she had lost almost 50 pounds through diet, exercise and Ozempic.  Patient stated that she started to have excess skin which caused rashes, especially on the posterior aspect of the pannus.  Patient also stated she had 2 areas of localized adiposity on the medial aspect of each thigh which was rubbing together and causing her discomfort.  On exam, patient was found to have a moderate pannus which Heang down to her symphysis pubis.  Patient was also noted to have redundant skin on her medial thighs.  Patient was found to be a good candidate for panniculectomy.  Liposuction was also offered for the patient for her  medial thighs.  Procedures were submitted to insurance.  Job: Retired  [employer] Significant for: Hypertension, panniculitis   Past Medical History: Allergies: Allergies  Allergen Reactions   Hydrocodone-Acetaminophen Itching    Current Medications:  Current Outpatient Medications:    ondansetron (ZOFRAN) 4 MG tablet, Take 1 tablet (4 mg total) by mouth every 8 (eight) hours as needed for up to 20 doses for nausea or vomiting., Disp: 20 tablet, Rfl: 0   oxyCODONE (ROXICODONE) 5 MG immediate release tablet, Take 1 tablet (5 mg total) by mouth every 6 (six) hours as needed for up to 20 doses for severe pain., Disp: 20 tablet, Rfl: 0   acetaminophen (TYLENOL) 500 MG tablet, Take 1,000 mg by mouth every 8 (eight) hours as needed for mild pain, fever or headache. , Disp: , Rfl:    alum & mag hydroxide-simeth (MAALOX MAX) 400-400-40 MG/5ML suspension, Take 15 mLs by mouth every 6 (six) hours as needed for indigestion., Disp: 355 mL, Rfl: 0   cholecalciferol (VITAMIN D3) 25 MCG (1000 UNIT) tablet, Take 1 tablet (1,000 Units total) by mouth daily., Disp: 30 tablet, Rfl: 0   diclofenac Sodium (VOLTAREN) 1 % GEL, Apply 2 g topically 4 (four) times daily as needed (pain). , Disp: , Rfl:    docusate sodium (COLACE) 100 MG capsule, Take 1 capsule (100 mg total) by mouth 2 (two) times daily., Disp: 10 capsule, Rfl: 0   metoprolol tartrate (LOPRESSOR) 25  MG tablet, Take 0.5 tablets (12.5 mg total) by mouth daily., Disp: 30 tablet, Rfl: 1   metoprolol-hydrochlorothiazide (LOPRESSOR HCT) 50-25 MG tablet, Take 1 tablet by mouth daily., Disp: , Rfl:    Multiple Vitamins-Minerals (MULTIVITAMIN WITH MINERALS) tablet, Take 1 tablet by mouth daily., Disp: , Rfl:    potassium chloride (KLOR-CON M) 10 MEQ tablet, Take 10 mEq by mouth daily., Disp: , Rfl:    potassium chloride SA (KLOR-CON) 20 MEQ tablet, Take 2 tablets (40 mEq total) by mouth daily., Disp: 10 tablet, Rfl: 0   simvastatin (ZOCOR) 20 MG tablet, Take 1  tablet (20 mg total) by mouth daily., Disp: 30 tablet, Rfl: 0  Past Medical Problems: Past Medical History:  Diagnosis Date   Arthritis    Blood transfusion    Chronic back pain    Hyperlipidemia    Hypertension    Neuromuscular disorder (HCC)    spasms in legs    Past Surgical History: Past Surgical History:  Procedure Laterality Date   BACK SURGERY     CESAREAN SECTION     CHOLECYSTECTOMY N/A 12/20/2019   Procedure: LAPAROSCOPIC CHOLECYSTECTOMY;  Surgeon: Axel Filler, MD;  Location: [facility];  Service: General;  Laterality: N/A;   COLONOSCOPY     POLYPECTOMY     TRIGGER FINGER RELEASE      Social History: Social History   Socioeconomic History   Marital status: Widowed    Spouse name: Not on file   Number of children: 2   Years of education: Not on file   Highest education level: Not on file  Occupational History   Not on file  Tobacco Use   Smoking status: Never   Smokeless tobacco: Never  Vaping Use   Vaping Use: Never used  Substance and Sexual Activity   Alcohol use: No   Drug use: No   Sexual activity: Yes  Other Topics Concern   Not on file  Social History Narrative   Not on file   Social Determinants of Health   Financial Resource Strain: Not on file  Food Insecurity: Not on file  Transportation Needs: Not on file  Physical Activity: Not on file  Stress: Not on file  Social Connections: Not on file  Intimate Partner Violence: Not on file    Family History: Family History  Problem Relation Age of Onset   Cancer Mother    Heart disease Mother    Heart disease Brother    Cancer Maternal Aunt    Colon cancer Neg Hx     Review of Systems: Denies any fevers, chills or changes in her health  Physical Exam: Vital Signs BP 132/80 (BP Location: Left Arm, Patient Position: Sitting, Cuff Size: Normal)   Pulse 64   Ht 5\' 3"  (1.6 m)   Wt 185 lb 3.2 oz (84 kg)   SpO2 96%   BMI 32.81 kg/m   Physical Exam  Constitutional:      General:  Not in acute distress.    Appearance: Normal appearance. Not ill-appearing.  HENT:     Head: Normocephalic and atraumatic.  Neck:     Musculoskeletal: Normal range of motion.  Cardiovascular:     Rate and Rhythm: Normal rate Pulmonary:     Effort: Pulmonary effort is normal. No respiratory distress.  Musculoskeletal: Normal range of motion.  Skin:    General: Skin is warm and dry.     Findings: No erythema or rash.  Neurological:     Mental Status: Alert  and oriented to person, place, and time. Mental status is at baseline.  Psychiatric:        Mood and Affect: Mood normal.        Behavior: Behavior normal.    Assessment/Plan: The patient is scheduled for panniculectomy with Dr. Ladona Ridgel.  Risks, benefits, and alternatives of procedure discussed, questions answered and consent obtained.    Smoking Status: Non-smoker; Counseling Given?  N/A  Caprini Score: 5; Risk Factors include: Age, BMI > 25, and length of planned surgery. Recommendation for mechanical prophylaxis. Encourage early ambulation.   Pictures obtained: @consult   Post-op Rx sent to pharmacy: Oxycodone, Zofran  I discussed with the patient to hold any vitamins or supplements 1 week prior to surgery.  Patient expressed understanding.  Patient was provided with the General Surgical Risk consent document and Pain Medication Agreement prior to their appointment.  They had adequate time to read through the risk consent documents and Pain Medication Agreement. We also discussed them in person together during this preop appointment. All of their questions were answered to their satisfaction.  Recommended calling if they have any further questions.  Risk consent form and Pain Medication Agreement to be scanned into patient's chart.  The consent was obtained with risks and complications reviewed which included bleeding, pain, scar, infection and the risk of anesthesia.  The patients questions were answered to the patients  expressed satisfaction.    Electronically signed by: Laurena Spies, PA-C 06/23/2022 12:02 PM

## 2022-06-23 NOTE — H&P (View-Only) (Signed)
   Patient ID: Raven Mclean, female    DOB: 11/21/1955, 66 y.o.   MRN: [id number]  Chief Complaint  Patient presents with   Pre-op Exam      ICD-10-CM   1. Excess skin of abdominal wall  L98.7        History of Present Illness: Raven Mclean is a 66 y.o.  female  with a history of panniculitis.  She presents for preoperative evaluation for upcoming procedure, panniculectomy, scheduled for 07/08/2022 with Dr. Taylor.  Patient reports she has had a little bit of postoperative nausea and vomiting from anesthesia.  Patient denies any history of cardiac disease.  She denies taking any blood thinners.  Patient reports she is not a smoker.  Patient denies taking any hormone replacement.  She reports history of 1 miscarriage many years ago.  She denies any history of blood clots or clotting diseases personally or in her family.  She denies any recent surgeries, traumas, infections or hospitalizations.  She denies any history of stroke or heart attack.  She denies any history of Crohn's disease, ulcerative colitis, COPD, asthma or cancer.  She denies any varicosities to her lower extremities.  She denies any recent fevers, chills or changes in her health.  Summary of Previous Visit: Patient was seen by Dr. Taylor on 04/05/2022 for discussion of removal of excess skin.  Patient reported that she had lost almost 50 pounds through diet, exercise and Ozempic.  Patient stated that she started to have excess skin which caused rashes, especially on the posterior aspect of the pannus.  Patient also stated she had 2 areas of localized adiposity on the medial aspect of each thigh which was rubbing together and causing her discomfort.  On exam, patient was found to have a moderate pannus which Heang down to her symphysis pubis.  Patient was also noted to have redundant skin on her medial thighs.  Patient was found to be a good candidate for panniculectomy.  Liposuction was also offered for the patient for her  medial thighs.  Procedures were submitted to insurance.  Job: Retired  [employer] Significant for: Hypertension, panniculitis   Past Medical History: Allergies: Allergies  Allergen Reactions   Hydrocodone-Acetaminophen Itching    Current Medications:  Current Outpatient Medications:    ondansetron (ZOFRAN) 4 MG tablet, Take 1 tablet (4 mg total) by mouth every 8 (eight) hours as needed for up to 20 doses for nausea or vomiting., Disp: 20 tablet, Rfl: 0   oxyCODONE (ROXICODONE) 5 MG immediate release tablet, Take 1 tablet (5 mg total) by mouth every 6 (six) hours as needed for up to 20 doses for severe pain., Disp: 20 tablet, Rfl: 0   acetaminophen (TYLENOL) 500 MG tablet, Take 1,000 mg by mouth every 8 (eight) hours as needed for mild pain, fever or headache. , Disp: , Rfl:    alum & mag hydroxide-simeth (MAALOX MAX) 400-400-40 MG/5ML suspension, Take 15 mLs by mouth every 6 (six) hours as needed for indigestion., Disp: 355 mL, Rfl: 0   cholecalciferol (VITAMIN D3) 25 MCG (1000 UNIT) tablet, Take 1 tablet (1,000 Units total) by mouth daily., Disp: 30 tablet, Rfl: 0   diclofenac Sodium (VOLTAREN) 1 % GEL, Apply 2 g topically 4 (four) times daily as needed (pain). , Disp: , Rfl:    docusate sodium (COLACE) 100 MG capsule, Take 1 capsule (100 mg total) by mouth 2 (two) times daily., Disp: 10 capsule, Rfl: 0   metoprolol tartrate (LOPRESSOR) 25   MG tablet, Take 0.5 tablets (12.5 mg total) by mouth daily., Disp: 30 tablet, Rfl: 1   metoprolol-hydrochlorothiazide (LOPRESSOR HCT) 50-25 MG tablet, Take 1 tablet by mouth daily., Disp: , Rfl:    Multiple Vitamins-Minerals (MULTIVITAMIN WITH MINERALS) tablet, Take 1 tablet by mouth daily., Disp: , Rfl:    potassium chloride (KLOR-CON M) 10 MEQ tablet, Take 10 mEq by mouth daily., Disp: , Rfl:    potassium chloride SA (KLOR-CON) 20 MEQ tablet, Take 2 tablets (40 mEq total) by mouth daily., Disp: 10 tablet, Rfl: 0   simvastatin (ZOCOR) 20 MG tablet, Take 1  tablet (20 mg total) by mouth daily., Disp: 30 tablet, Rfl: 0  Past Medical Problems: Past Medical History:  Diagnosis Date   Arthritis    Blood transfusion    Chronic back pain    Hyperlipidemia    Hypertension    Neuromuscular disorder (HCC)    spasms in legs    Past Surgical History: Past Surgical History:  Procedure Laterality Date   BACK SURGERY     CESAREAN SECTION     CHOLECYSTECTOMY N/A 12/20/2019   Procedure: LAPAROSCOPIC CHOLECYSTECTOMY;  Surgeon: Ramirez, Armando, MD;  Location: [facility];  Service: General;  Laterality: N/A;   COLONOSCOPY     POLYPECTOMY     TRIGGER FINGER RELEASE      Social History: Social History   Socioeconomic History   Marital status: Widowed    Spouse name: Not on file   Number of children: 2   Years of education: Not on file   Highest education level: Not on file  Occupational History   Not on file  Tobacco Use   Smoking status: Never   Smokeless tobacco: Never  Vaping Use   Vaping Use: Never used  Substance and Sexual Activity   Alcohol use: No   Drug use: No   Sexual activity: Yes  Other Topics Concern   Not on file  Social History Narrative   Not on file   Social Determinants of Health   Financial Resource Strain: Not on file  Food Insecurity: Not on file  Transportation Needs: Not on file  Physical Activity: Not on file  Stress: Not on file  Social Connections: Not on file  Intimate Partner Violence: Not on file    Family History: Family History  Problem Relation Age of Onset   Cancer Mother    Heart disease Mother    Heart disease Brother    Cancer Maternal Aunt    Colon cancer Neg Hx     Review of Systems: Denies any fevers, chills or changes in her health  Physical Exam: Vital Signs BP 132/80 (BP Location: Left Arm, Patient Position: Sitting, Cuff Size: Normal)   Pulse 64   Ht 5' 3" (1.6 m)   Wt 185 lb 3.2 oz (84 kg)   SpO2 96%   BMI 32.81 kg/m   Physical Exam  Constitutional:      General:  Not in acute distress.    Appearance: Normal appearance. Not ill-appearing.  HENT:     Head: Normocephalic and atraumatic.  Neck:     Musculoskeletal: Normal range of motion.  Cardiovascular:     Rate and Rhythm: Normal rate Pulmonary:     Effort: Pulmonary effort is normal. No respiratory distress.  Musculoskeletal: Normal range of motion.  Skin:    General: Skin is warm and dry.     Findings: No erythema or rash.  Neurological:     Mental Status: Alert   and oriented to person, place, and time. Mental status is at baseline.  Psychiatric:        Mood and Affect: Mood normal.        Behavior: Behavior normal.    Assessment/Plan: The patient is scheduled for panniculectomy with Dr. Taylor.  Risks, benefits, and alternatives of procedure discussed, questions answered and consent obtained.    Smoking Status: Non-smoker; Counseling Given?  N/A  Caprini Score: 5; Risk Factors include: Age, BMI > 25, and length of planned surgery. Recommendation for mechanical prophylaxis. Encourage early ambulation.   Pictures obtained: @consult  Post-op Rx sent to pharmacy: Oxycodone, Zofran  I discussed with the patient to hold any vitamins or supplements 1 week prior to surgery.  Patient expressed understanding.  Patient was provided with the General Surgical Risk consent document and Pain Medication Agreement prior to their appointment.  They had adequate time to read through the risk consent documents and Pain Medication Agreement. We also discussed them in person together during this preop appointment. All of their questions were answered to their satisfaction.  Recommended calling if they have any further questions.  Risk consent form and Pain Medication Agreement to be scanned into patient's chart.  The consent was obtained with risks and complications reviewed which included bleeding, pain, scar, infection and the risk of anesthesia.  The patients questions were answered to the patients  expressed satisfaction.    Electronically signed by: Cas Tracz E Janani Chamber, PA-C 06/23/2022 12:02 PM  

## 2022-07-01 ENCOUNTER — Other Ambulatory Visit (HOSPITAL_COMMUNITY): Payer: Self-pay

## 2022-07-01 ENCOUNTER — Encounter (HOSPITAL_BASED_OUTPATIENT_CLINIC_OR_DEPARTMENT_OTHER): Payer: Self-pay | Admitting: Plastic Surgery

## 2022-07-01 ENCOUNTER — Other Ambulatory Visit: Payer: Self-pay

## 2022-07-07 ENCOUNTER — Encounter (HOSPITAL_BASED_OUTPATIENT_CLINIC_OR_DEPARTMENT_OTHER)
Admission: RE | Admit: 2022-07-07 | Discharge: 2022-07-07 | Disposition: A | Discharge status: Home / Self Care | Payer: Medicare HMO | Source: Ambulatory Visit | Attending: Plastic Surgery | Admitting: Plastic Surgery

## 2022-07-07 DIAGNOSIS — M793 Panniculitis, unspecified: Secondary | ICD-10-CM | POA: Diagnosis not present

## 2022-07-07 DIAGNOSIS — I10 Essential (primary) hypertension: Secondary | ICD-10-CM | POA: Diagnosis not present

## 2022-07-07 DIAGNOSIS — M1990 Unspecified osteoarthritis, unspecified site: Secondary | ICD-10-CM | POA: Diagnosis not present

## 2022-07-07 DIAGNOSIS — Z79899 Other long term (current) drug therapy: Secondary | ICD-10-CM | POA: Diagnosis not present

## 2022-07-07 LAB — BASIC METABOLIC PANEL
Anion gap: 9 (ref 5–15)
BUN: 11 mg/dL (ref 8–23)
CO2: 26 mmol/L (ref 22–32)
Calcium: 9.6 mg/dL (ref 8.9–10.3)
Chloride: 101 mmol/L (ref 98–111)
Creatinine, Ser: 0.78 mg/dL (ref 0.44–1.00)
GFR, Estimated: >60 mL/min (ref >60)
Glucose, Bld: 72 mg/dL (ref 70–99)
Potassium: 4 mmol/L (ref 3.5–5.1)
Sodium: 136 mmol/L (ref 135–145)

## 2022-07-08 ENCOUNTER — Ambulatory Visit (HOSPITAL_BASED_OUTPATIENT_CLINIC_OR_DEPARTMENT_OTHER): Payer: Medicare HMO | Admitting: Anesthesiology

## 2022-07-08 ENCOUNTER — Other Ambulatory Visit: Payer: Self-pay

## 2022-07-08 ENCOUNTER — Encounter (HOSPITAL_BASED_OUTPATIENT_CLINIC_OR_DEPARTMENT_OTHER): Payer: Self-pay | Admitting: Plastic Surgery

## 2022-07-08 ENCOUNTER — Encounter (HOSPITAL_BASED_OUTPATIENT_CLINIC_OR_DEPARTMENT_OTHER)
Admission: RE | Disposition: A | Discharge status: Home / Self Care | Payer: Self-pay | Source: Home / Self Care | Attending: Plastic Surgery

## 2022-07-08 ENCOUNTER — Ambulatory Visit (HOSPITAL_BASED_OUTPATIENT_CLINIC_OR_DEPARTMENT_OTHER)
Admission: RE | Admit: 2022-07-08 | Discharge: 2022-07-08 | Disposition: A | Discharge status: Home / Self Care | Payer: Medicare HMO | Attending: Plastic Surgery | Admitting: Plastic Surgery

## 2022-07-08 DIAGNOSIS — M793 Panniculitis, unspecified: Secondary | ICD-10-CM

## 2022-07-08 DIAGNOSIS — Z79899 Other long term (current) drug therapy: Secondary | ICD-10-CM | POA: Insufficient documentation

## 2022-07-08 DIAGNOSIS — M1990 Unspecified osteoarthritis, unspecified site: Secondary | ICD-10-CM | POA: Insufficient documentation

## 2022-07-08 DIAGNOSIS — I10 Essential (primary) hypertension: Secondary | ICD-10-CM | POA: Insufficient documentation

## 2022-07-08 HISTORY — PX: PANNICULECTOMY: SHX5360

## 2022-07-08 HISTORY — DX: Family history of other specified conditions: Z84.89

## 2022-07-08 SURGERY — PANNICULECTOMY
Anesthesia: General | Site: Abdomen

## 2022-07-08 MED ORDER — PHENYLEPHRINE HCL-NACL 20-0.9 MG/250ML-% IV SOLN: INTRAVENOUS | Status: DC | PRN

## 2022-07-08 MED ORDER — SODIUM CHLORIDE 0.9 % IV SOLN
INTRAVENOUS | Status: DC | PRN
  Administered 2022-07-08: 40 mL

## 2022-07-08 MED ORDER — CHLORHEXIDINE GLUCONATE CLOTH 2 % EX PADS: 6.0000 | MEDICATED_PAD | Freq: Once | CUTANEOUS | Status: DC

## 2022-07-08 MED ORDER — LIDOCAINE HCL (CARDIAC) PF 100 MG/5ML IV SOSY
PREFILLED_SYRINGE | INTRAVENOUS | Status: DC | PRN
  Administered 2022-07-08: 100 mg via INTRAVENOUS

## 2022-07-08 MED ORDER — ATROPINE SULFATE 0.4 MG/ML IV SOLN
INTRAVENOUS | Status: AC
  Filled 2022-07-08: qty 1

## 2022-07-08 MED ORDER — EPHEDRINE 5 MG/ML INJ
INTRAVENOUS | Status: AC
  Filled 2022-07-08: qty 5

## 2022-07-08 MED ORDER — DEXAMETHASONE SODIUM PHOSPHATE 10 MG/ML IJ SOLN
INTRAMUSCULAR | Status: AC
  Filled 2022-07-08: qty 1

## 2022-07-08 MED ORDER — PHENYLEPHRINE HCL (PRESSORS) 10 MG/ML IV SOLN
INTRAVENOUS | Status: DC | PRN
  Administered 2022-07-08: 160 ug via INTRAVENOUS

## 2022-07-08 MED ORDER — CEFAZOLIN SODIUM-DEXTROSE 2-4 GM/100ML-% IV SOLN
INTRAVENOUS | Status: AC
  Filled 2022-07-08: qty 100

## 2022-07-08 MED ORDER — MIDAZOLAM HCL 5 MG/5ML IJ SOLN
INTRAMUSCULAR | Status: DC | PRN
  Administered 2022-07-08: 2 mg via INTRAVENOUS

## 2022-07-08 MED ORDER — ACETAMINOPHEN 500 MG PO TABS
ORAL_TABLET | ORAL | Status: AC
  Filled 2022-07-08: qty 2

## 2022-07-08 MED ORDER — FENTANYL CITRATE (PF) 100 MCG/2ML IJ SOLN
INTRAMUSCULAR | Status: DC | PRN
  Administered 2022-07-08: 100 ug via INTRAVENOUS

## 2022-07-08 MED ORDER — ROCURONIUM BROMIDE 10 MG/ML (PF) SYRINGE
PREFILLED_SYRINGE | INTRAVENOUS | Status: AC
  Filled 2022-07-08: qty 10

## 2022-07-08 MED ORDER — PROPOFOL 500 MG/50ML IV EMUL
INTRAVENOUS | Status: DC | PRN
  Administered 2022-07-08: 250 ug/kg/min via INTRAVENOUS

## 2022-07-08 MED ORDER — DEXAMETHASONE SODIUM PHOSPHATE 4 MG/ML IJ SOLN
INTRAMUSCULAR | Status: DC | PRN
  Administered 2022-07-08: 5 mg via INTRAVENOUS

## 2022-07-08 MED ORDER — LIDOCAINE 2% (20 MG/ML) 5 ML SYRINGE
INTRAMUSCULAR | Status: AC
  Filled 2022-07-08: qty 5

## 2022-07-08 MED ORDER — SUCCINYLCHOLINE CHLORIDE 200 MG/10ML IV SOSY
PREFILLED_SYRINGE | INTRAVENOUS | Status: AC
  Filled 2022-07-08: qty 10

## 2022-07-08 MED ORDER — HYDROMORPHONE HCL 1 MG/ML IJ SOLN
INTRAMUSCULAR | Status: AC
  Filled 2022-07-08: qty 0.5

## 2022-07-08 MED ORDER — PROPOFOL 10 MG/ML IV BOLUS
INTRAVENOUS | Status: DC | PRN
  Administered 2022-07-08: 160 mg via INTRAVENOUS

## 2022-07-08 MED ORDER — MIDAZOLAM HCL 2 MG/2ML IJ SOLN
INTRAMUSCULAR | Status: AC
  Filled 2022-07-08: qty 2

## 2022-07-08 MED ORDER — ROCURONIUM BROMIDE 100 MG/10ML IV SOLN
INTRAVENOUS | Status: DC | PRN
  Administered 2022-07-08: 60 mg via INTRAVENOUS

## 2022-07-08 MED ORDER — PHENYLEPHRINE HCL-NACL 20-0.9 MG/250ML-% IV SOLN
INTRAVENOUS | Status: DC | PRN
  Administered 2022-07-08: 35 ug/min via INTRAVENOUS

## 2022-07-08 MED ORDER — FENTANYL CITRATE (PF) 100 MCG/2ML IJ SOLN
INTRAMUSCULAR | Status: AC
  Filled 2022-07-08: qty 2

## 2022-07-08 MED ORDER — LACTATED RINGERS IV SOLN: INTRAVENOUS | Status: DC

## 2022-07-08 MED ORDER — 0.9 % SODIUM CHLORIDE (POUR BTL) OPTIME
TOPICAL | Status: DC | PRN
  Administered 2022-07-08: 900 mL

## 2022-07-08 MED ORDER — PHENYLEPHRINE HCL (PRESSORS) 10 MG/ML IV SOLN
INTRAVENOUS | Status: AC
  Filled 2022-07-08: qty 1

## 2022-07-08 MED ORDER — ACETAMINOPHEN 500 MG PO TABS
1000.0000 mg | ORAL_TABLET | Freq: Once | ORAL | Status: AC
  Administered 2022-07-08: 1000 mg via ORAL

## 2022-07-08 MED ORDER — AMISULPRIDE (ANTIEMETIC) 5 MG/2ML IV SOLN: 10.0000 mg | Freq: Once | INTRAVENOUS | Status: DC | PRN

## 2022-07-08 MED ORDER — ONDANSETRON HCL 4 MG/2ML IJ SOLN
INTRAMUSCULAR | Status: AC
  Filled 2022-07-08: qty 2

## 2022-07-08 MED ORDER — PHENYLEPHRINE 80 MCG/ML (10ML) SYRINGE FOR IV PUSH (FOR BLOOD PRESSURE SUPPORT)
PREFILLED_SYRINGE | INTRAVENOUS | Status: AC
  Filled 2022-07-08: qty 10

## 2022-07-08 MED ORDER — HYDROMORPHONE HCL 1 MG/ML IJ SOLN
0.2500 mg | INTRAMUSCULAR | Status: DC | PRN
  Administered 2022-07-08: 0.5 mg via INTRAVENOUS

## 2022-07-08 MED ORDER — CEFAZOLIN SODIUM-DEXTROSE 2-4 GM/100ML-% IV SOLN
2.0000 g | INTRAVENOUS | Status: AC
  Administered 2022-07-08: 2 g via INTRAVENOUS

## 2022-07-08 SURGICAL SUPPLY — 65 items
ADH SKN CLS APL DERMABOND .7 (GAUZE/BANDAGES/DRESSINGS) ×4
APPLIER CLIP 9.375 MED OPEN (MISCELLANEOUS) ×1
APR CLP MED 9.3 20 MLT OPN (MISCELLANEOUS) ×1
BINDER ABDOMINAL 12 SM 30-45 (SOFTGOODS) IMPLANT
BIOPATCH RED 1 DISK 7.0 (GAUZE/BANDAGES/DRESSINGS) IMPLANT
BLADE CLIPPER SURG (BLADE) IMPLANT
BLADE SURG 10 STRL SS (BLADE) ×4 IMPLANT
BLADE SURG 11 STRL SS (BLADE) ×1 IMPLANT
BLADE SURG 15 STRL LF DISP TIS (BLADE) IMPLANT
BLADE SURG 15 STRL SS (BLADE) ×1
CANISTER SUCT 1200ML W/VALVE (MISCELLANEOUS) ×1 IMPLANT
CLIP APPLIE 9.375 MED OPEN (MISCELLANEOUS) IMPLANT
DERMABOND ADVANCED .7 DNX12 (GAUZE/BANDAGES/DRESSINGS) ×2 IMPLANT
DRAIN CHANNEL 19F RND (DRAIN) IMPLANT
DRAPE UTILITY XL STRL (DRAPES) ×1 IMPLANT
DRSG TEGADERM 4X4.75 (GAUZE/BANDAGES/DRESSINGS) IMPLANT
ELECT BLADE 4.0 EZ CLEAN MEGAD (MISCELLANEOUS)
ELECT COATED BLADE 2.86 ST (ELECTRODE) ×1 IMPLANT
ELECT REM PT RETURN 9FT ADLT (ELECTROSURGICAL) ×2
ELECTRODE BLDE 4.0 EZ CLN MEGD (MISCELLANEOUS) IMPLANT
ELECTRODE REM PT RTRN 9FT ADLT (ELECTROSURGICAL) ×2 IMPLANT
EVACUATOR SILICONE 100CC (DRAIN) IMPLANT
GAUZE PAD ABD 8X10 STRL (GAUZE/BANDAGES/DRESSINGS) ×3 IMPLANT
GAUZE SPONGE 2X2 STRL 8-PLY (GAUZE/BANDAGES/DRESSINGS) IMPLANT
GLOVE BIO SURGEON STRL SZ 6.5 (GLOVE) IMPLANT
GLOVE BIO SURGEON STRL SZ7 (GLOVE) IMPLANT
GLOVE BIO SURGEON STRL SZ8 (GLOVE) ×1 IMPLANT
GLOVE BIOGEL M STRL SZ7.5 (GLOVE) ×1 IMPLANT
GLOVE BIOGEL PI IND STRL 7.0 (GLOVE) IMPLANT
GLOVE BIOGEL PI IND STRL 7.5 (GLOVE) IMPLANT
GLOVE BIOGEL PI IND STRL 8 (GLOVE) ×1 IMPLANT
GLOVE SURG SYN 7.5 E (GLOVE) ×1 IMPLANT
GLOVE SURG SYN 7.5 PF PI (GLOVE) IMPLANT
GOWN STRL REUS W/ TWL LRG LVL3 (GOWN DISPOSABLE) ×1 IMPLANT
GOWN STRL REUS W/ TWL XL LVL3 (GOWN DISPOSABLE) ×2 IMPLANT
GOWN STRL REUS W/TWL LRG LVL3 (GOWN DISPOSABLE) ×2
GOWN STRL REUS W/TWL XL LVL3 (GOWN DISPOSABLE) ×3
HEMOSTAT ARISTA ABSORB 1G (HEMOSTASIS) IMPLANT
HIBICLENS CHG 4% 4OZ BTL (MISCELLANEOUS) ×1 IMPLANT
NDL HYPO 25X1 1.5 SAFETY (NEEDLE) IMPLANT
NEEDLE HYPO 25X1 1.5 SAFETY (NEEDLE) ×1 IMPLANT
NS IRRIG 1000ML POUR BTL (IV SOLUTION) ×1 IMPLANT
PACK BASIN DAY SURGERY FS (CUSTOM PROCEDURE TRAY) ×1 IMPLANT
PACK UNIVERSAL I (CUSTOM PROCEDURE TRAY) ×1 IMPLANT
PENCIL SMOKE EVACUATOR (MISCELLANEOUS) ×2 IMPLANT
PIN SAFETY STERILE (MISCELLANEOUS) ×1 IMPLANT
SLEEVE SCD COMPRESS KNEE MED (STOCKING) ×1 IMPLANT
SPONGE T-LAP 18X18 ~~LOC~~+RFID (SPONGE) ×2 IMPLANT
STAPLER INSORB 30 2030 C-SECTI (MISCELLANEOUS) IMPLANT
STAPLER VISISTAT 35W (STAPLE) ×1 IMPLANT
SUT MNCRL AB 3-0 PS2 18 (SUTURE) ×4 IMPLANT
SUT MNCRL AB 4-0 PS2 18 (SUTURE) ×2 IMPLANT
SUT SILK 2 0 PERMA HAND 18 BK (SUTURE) ×1 IMPLANT
SUT VIC AB 2-0 CT1 27 (SUTURE) ×3
SUT VIC AB 2-0 CT1 TAPERPNT 27 (SUTURE) ×2 IMPLANT
SUT VIC AB 3-0 PS1 18 (SUTURE) ×1
SUT VIC AB 3-0 PS1 18XBRD (SUTURE) ×2 IMPLANT
SUT VICRYL AB 3 0 TIES (SUTURE) IMPLANT
SYR BULB IRRIG 60ML STRL (SYRINGE) ×1 IMPLANT
SYR CONTROL 10ML LL (SYRINGE) IMPLANT
TOWEL GREEN STERILE FF (TOWEL DISPOSABLE) ×2 IMPLANT
TRAY DSU PREP LF (CUSTOM PROCEDURE TRAY) ×1 IMPLANT
TUBE CONNECTING 20X1/4 (TUBING) ×1 IMPLANT
UNDERPAD 30X36 HEAVY ABSORB (UNDERPADS AND DIAPERS) ×2 IMPLANT
YANKAUER SUCT BULB TIP NO VENT (SUCTIONS) ×1 IMPLANT

## 2022-07-08 NOTE — Discharge Instructions (Addendum)
Activity as tolerated.  You may shower in 24 hours. Do not submerge the incision in bath, pools, hot tubs, etc. Wear binder at all times except when showering.  NO driving while taking pain medications   No heavy activities  Diet: Regular, Try to optimize nutrition with plenty of proteins and vegetables to improve healing.  Wound Care: Change drain site dressings daily and as needed for saturation with gauze and tape.   Special Instructions: Continue to empty, recharge, & record drainage from drains 2-3 times a day, as needed.  Call doctor if any unusual problems occur such as pain, excessive bleeding, unrelieved nausea/vomiting, fever &/or chills  Follow-up appointment: Previously scheduled. Medications: Called in at your pre-op appointment.   No Tylenol until after 3:30pm today if needed  Post Anesthesia Home Care Instructions  Activity: Get plenty of rest for the remainder of the day. A responsible individual must stay with you for 24 hours following the procedure.  For the next 24 hours, DO NOT: -Drive a car -Advertising copywriter -Drink alcoholic beverages -Take any medication unless instructed by your physician -Make any legal decisions or sign important papers.  Meals: Start with liquid foods such as gelatin or soup. Progress to regular foods as tolerated. Avoid greasy, spicy, heavy foods. If nausea and/or vomiting occur, drink only clear liquids until the nausea and/or vomiting subsides. Call your physician if vomiting continues.  Special Instructions/Symptoms: Your throat may feel dry or sore from the anesthesia or the breathing tube placed in your throat during surgery. If this causes discomfort, gargle with warm salt water. The discomfort should disappear within 24 hours.  If you had a scopolamine patch placed behind your ear for the management of post- operative nausea and/or vomiting:  1. The medication in the patch is effective for 72 hours, after which it should  be removed.  Wrap patch in a tissue and discard in the trash. Wash hands thoroughly with soap and water. 2. You may remove the patch earlier than 72 hours if you experience unpleasant side effects which may include dry mouth, dizziness or visual disturbances. 3. Avoid touching the patch. Wash your hands with soap and water after contact with the patch.

## 2022-07-08 NOTE — Transfer of Care (Signed)
Immediate Anesthesia Transfer of Care Note  Patient: Raven Mclean  Procedure(s) Performed: PANNICULECTOMY (Abdomen)  Patient Location: PACU  Anesthesia Type:General  Level of Consciousness: awake, alert , oriented, drowsy, and patient cooperative  Airway & Oxygen Therapy: Patient Spontanous Breathing and Patient connected to face mask oxygen  Post-op Assessment: Report given to RN and Post -op Vital signs reviewed and stable  Post vital signs: Reviewed and stable  Last Vitals:  Vitals Value Taken Time  BP    Temp    Pulse    Resp    SpO2      Last Pain:  Vitals:   07/08/22 0933  TempSrc: Oral  PainSc: 0-No pain      Patients Stated Pain Goal: 4 (07/08/22 0933)  Complications: No notable events documented.

## 2022-07-08 NOTE — Op Note (Signed)
DATE OF OPERATION: 07/08/2022  LOCATION: [facility] operating Room  PREOPERATIVE DIAGNOSIS: Panniculitis  POSTOPERATIVE DIAGNOSIS: Same  PROCEDURE: Panniculectomy  SURGEON: Loren Racer, MD  ASSISTANT: Burna Forts, primary, Caroline More  EBL: 100 cc  CONDITION: Stable  COMPLICATIONS: None  INDICATION: The patient, Raven Mclean, is a 67 y.o. female born on 06/24/1955, is here for treatment ongoing rashes on the posterior aspect of her pannus.Marland Kitchen   PROCEDURE DETAILS:  The patient was seen prior to surgery and marked.   IV antibiotics were given. The patient was taken to the operating room and given a general anesthetic. A standard time out was performed and all information was confirmed by those in the room. SCDs were placed.   The abdomen was prepped and draped in usual sterile manner.  Curvilinear incision was made across the lower portion of the abdomen and dissection carried out through the subcutaneous tissue to the anterior abdominal wall fascia.  The skin and fat was elevated from the fascia in a caudad to cranial manner up to the level of the umbilicus.  The skin to be excised was marked and the superior incision was made sharply.  Once the skin and fat were excised the surgical site was inspected for bleeding and meticulous hemostasis was achieved with the electrocautery.  The surgical wound was irrigated with warm normal saline.  Fascia and subcutaneous tissues were infiltrated with Exparel.  219 French round drains were placed through separate stab incisions.  Scarpa's fascia was approximated with interrupted 2-0 and 3-0 Vicryl sutures.  The dermis was closed with interrupted and running 3-0 Monocryl sutures and the skin was closed with a running 4-0 Monocryl subcuticular stitch.  The incisions were sealed with Dermabond.  The patient was placed in a compressive garment and awakened from anesthesia without incident she was transferred to the recovery room in good condition.   All instrument needle and sponge counts were reported as correct.  There were no complications. The patient was allowed to wake up and taken to recovery room in stable condition at the end of the case. The family was notified at the end of the case.   The advanced practice practitioner (APP) assisted throughout the case.  The APP was essential in retraction and counter traction when needed to make the case progress smoothly.  This retraction and assistance made it possible to see the tissue plans for the procedure.  The assistance was needed for blood control, tissue re-approximation and assisted with closure of the incision site.

## 2022-07-08 NOTE — Interval H&P Note (Signed)
History and Physical Interval Note: Pt met in the pre op area, no change in physical exam or indication for surgery. Surgical site marked with her assistance and concurrence. All questions answered to her satisfaction. Will proceed with a panniculectomy at her request   07/08/2022 10:16 AM  Raven Mclean  has presented today for surgery, with the diagnosis of panniculitis.  The various methods of treatment have been discussed with the patient and family. After consideration of risks, benefits and other options for treatment, the patient has consented to  Procedure(s): PANNICULECTOMY (N/A) as a surgical intervention.  The patient's history has been reviewed, patient examined, no change in status, stable for surgery.  I have reviewed the patient's chart and labs.  Questions were answered to the patient's satisfaction.     Santiago Glad

## 2022-07-08 NOTE — Anesthesia Preprocedure Evaluation (Addendum)
Anesthesia Evaluation  Patient identified by MRN, date of birth, ID band Patient awake    Reviewed: Allergy & Precautions, NPO status , Patient's Chart, lab work & pertinent test results, reviewed documented beta blocker date and time   History of Anesthesia Complications (+) PONV and history of anesthetic complications  Airway Mallampati: II  TM Distance: >3 FB Neck ROM: Full    Dental  (+) Missing,    Pulmonary neg pulmonary ROS   Pulmonary exam normal        Cardiovascular hypertension, Pt. on medications and Pt. on home beta blockers Normal cardiovascular exam     Neuro/Psych    GI/Hepatic negative GI ROS, Neg liver ROS,,,  Endo/Other  negative endocrine ROS    Renal/GU negative Renal ROS  negative genitourinary   Musculoskeletal  (+) Arthritis ,    Abdominal   Peds  Hematology negative hematology ROS (+)   Anesthesia Other Findings panniculitis  Reproductive/Obstetrics                             Anesthesia Physical Anesthesia Plan  ASA: 2  Anesthesia Plan: General   Post-op Pain Management: Tylenol PO (pre-op)*   Induction: Intravenous  PONV Risk Score and Plan: 4 or greater and Treatment may vary due to age or medical condition, Ondansetron, Dexamethasone, Midazolam, Propofol infusion and TIVA  Airway Management Planned: Oral ETT  Additional Equipment: None  Intra-op Plan:   Post-operative Plan: Extubation in OR  Informed Consent: I have reviewed the patients History and Physical, chart, labs and discussed the procedure including the risks, benefits and alternatives for the proposed anesthesia with the patient or authorized representative who has indicated his/her understanding and acceptance.     Dental advisory given  Plan Discussed with: CRNA  Anesthesia Plan Comments:         Anesthesia Quick Evaluation

## 2022-07-08 NOTE — Anesthesia Procedure Notes (Signed)
Procedure Name: Intubation Date/Time: 07/08/2022 10:48 AM  Performed by: Ronnette Hila, CRNAPre-anesthesia Checklist: Patient identified, Emergency Drugs available, Suction available and Patient being monitored Patient Re-evaluated:Patient Re-evaluated prior to induction Oxygen Delivery Method: Circle system utilized Preoxygenation: Pre-oxygenation with 100% oxygen Induction Type: IV induction Ventilation: Mask ventilation without difficulty Laryngoscope Size: Mac and 3 Grade View: Grade II Tube type: Oral Tube size: 7.0 mm Number of attempts: 1 Airway Equipment and Method: Stylet and Oral airway Placement Confirmation: ETT inserted through vocal cords under direct vision, positive ETCO2 and breath sounds checked- equal and bilateral Secured at: 22 cm Tube secured with: Tape Dental Injury: Teeth and Oropharynx as per pre-operative assessment

## 2022-07-09 ENCOUNTER — Encounter (HOSPITAL_BASED_OUTPATIENT_CLINIC_OR_DEPARTMENT_OTHER): Payer: Self-pay | Admitting: Plastic Surgery

## 2022-07-10 NOTE — Anesthesia Postprocedure Evaluation (Signed)
Anesthesia Post Note  Patient: Raven Mclean  Procedure(s) Performed: PANNICULECTOMY (Abdomen)     Patient location during evaluation: PACU Anesthesia Type: General Level of consciousness: awake and alert Pain management: pain level controlled Vital Signs Assessment: post-procedure vital signs reviewed and stable Respiratory status: spontaneous breathing, nonlabored ventilation and respiratory function stable Cardiovascular status: blood pressure returned to baseline Postop Assessment: no apparent nausea or vomiting Anesthetic complications: no   No notable events documented.  Last Vitals:  Vitals:   07/08/22 1330 07/08/22 1441  BP: (!) 141/81 (!) 144/68  Pulse: 66 (!) 56  Resp: 13   Temp:  (!) 36.2 C  SpO2: 98% 96%    Last Pain:  Vitals:   07/08/22 1441  TempSrc: Temporal  PainSc: 0-No pain                 Shanda Howells

## 2022-07-11 ENCOUNTER — Telehealth: Payer: Self-pay | Admitting: *Deleted

## 2022-07-11 ENCOUNTER — Telehealth: Payer: Self-pay

## 2022-07-11 NOTE — Telephone Encounter (Signed)
Attempted to return pt's call. She called the office earlier this afternoon and c/o bleeding from the right side of her incision. She had panniculectomy with Dr. Ladona Ridgel on 6/7.  Called home number-just rang and unable to leave VM. Call cell # and left VM to all office if she still needed assistance. Caroline More is aware and has also attempted to reach patient.

## 2022-07-11 NOTE — Telephone Encounter (Signed)
Attempted to call patient's home phone and mobile phone as I was informed patient called our clinic with concerns.  Unable to leave voicemail to return call.

## 2022-07-12 NOTE — Telephone Encounter (Signed)
Patient called back and I transferred to [facility]

## 2022-07-12 NOTE — Telephone Encounter (Signed)
I called the patient and spoke with her in regards to her concern.  Patient states that yesterday she started noticing some drainage around her right drain site.  She states that she has been reinforcing the area with gauze.  She states that very minimal output is coming out of her bulb, but her bulb is not staying charged for a very long period of time after she empties and charges it.  She states she otherwise is doing very well and has no complaints or concerns.  I discussed with the patient would like her to reinforce the drain site with gauze, ABD pads and/or maxipads.  I recommended the patient be seen in the clinic tomorrow as her drain may be malfunctioning.  Patient expressed understanding was in agreement with the plan.  I instructed the patient to call if she has any further questions or concerns.

## 2022-07-12 NOTE — Telephone Encounter (Signed)
I spoke with Raven Mclean who states she is having a lot of drainage coming from around the drainage tube site on the right side. She would like a call back

## 2022-07-13 ENCOUNTER — Ambulatory Visit (INDEPENDENT_AMBULATORY_CARE_PROVIDER_SITE_OTHER): Payer: Medicare HMO | Admitting: Physician Assistant

## 2022-07-13 DIAGNOSIS — L987 Excessive and redundant skin and subcutaneous tissue: Secondary | ICD-10-CM

## 2022-07-13 NOTE — Progress Notes (Signed)
This is a 67 year old female seen in our office for follow-up evaluation status post panniculectomy by Dr. Ladona Ridgel on 07/08/2022.  The patient notes she is doing well postoperatively.  She notes that over the last several days her drains have not held suction.  She notes she will express the air but the bulbs fill up almost immediately with air.  She notes she has had some drainage around the drain sites particularly on the right side.  She denies any other issues including abdominal pain, she does note some discomfort.  She denies any fevers, or any other issues today.  On exam abdomen with no overlying redness, no palpable fluid collections or area of firmness.  Her incision is clean dry and intact with Dermabond in place.  Bilateral drains with serosanguineous output, drain sites are clean.  Overall the patient is doing well postoperatively.  She did have concern that her drains were not holding suction.  I did compress the bulbs which held suction, the patient stayed in our office for approximate 20 minutes, the drains were functioning properly without issue.  I did reinforce the drains with PDS suture around the drain/bulb connection.  The patient does have a follow-up appointment this week with Dr. Ladona Ridgel, I did encourage her to continue her follow-up so he may evaluate her.  She was given strict return precautions.  She verbalized understanding and agreement to today's plan.

## 2022-07-14 ENCOUNTER — Ambulatory Visit (INDEPENDENT_AMBULATORY_CARE_PROVIDER_SITE_OTHER): Payer: Medicare HMO | Admitting: Plastic Surgery

## 2022-07-14 DIAGNOSIS — Z9889 Other specified postprocedural states: Secondary | ICD-10-CM

## 2022-07-14 NOTE — Progress Notes (Signed)
Raven Mclean returns today for evaluation after her panniculectomy.  She was seen yesterday because her drains were not holding suction.  The drains were evaluated today and reinforced with sutures at the connection point.  She states they are holding suction much better and she now has approximately 25 mL per drain each day.  On evaluation the incisions are clean dry and intact.  We discussed proper placement of her compressive garment.  She will return on Monday to have the right drain removed if it remains under 30 mL over the next couple of days.

## 2022-07-18 ENCOUNTER — Ambulatory Visit (INDEPENDENT_AMBULATORY_CARE_PROVIDER_SITE_OTHER): Payer: Medicare HMO | Admitting: Surgical

## 2022-07-18 ENCOUNTER — Encounter: Payer: Self-pay | Admitting: Physician Assistant

## 2022-07-18 DIAGNOSIS — Z9889 Other specified postprocedural states: Secondary | ICD-10-CM

## 2022-07-18 NOTE — Progress Notes (Signed)
Patient is a 67 year old female here for follow-up after panniculectomy with Dr. Ladona Ridgel on 07/08/2022.  She was last seen in the clinic on Thursday last week, she was overall doing well.  She is here today for reevaluation of JP drain output, previously discussed removal of JP drain today if output was less than 30 mL per 24 hours.  Patient reports today that she is doing really well, she reports output has been approximately 10 cc per 12 hours.  She is feeling well.  She is not having any infectious symptoms.  She reports she has been ambulating well.  She reports that she does have swelling of her left leg, reports this is normal and baseline for her.  She reports that has been present since having back surgery.  She denies any shortness of breath or chest pain, she is not having any lower extremity pain.  She has been ambulating and is going to the [facility] today to walk on the treadmill.  Chaperone present on exam On exam abdominal incision is intact and appears to be healing well.  Bilateral JP drains are in place, left with dark sanguinous fluid, right with serosanguineous fluid.  There is approximately 20 cc in the left JP drain and 10 cc in the right JP drain.  There is no abdominal erythema or cellulitic changes noted.  There is minimal tenderness with palpation.  I do not appreciate any subcutaneous fluid collections.  There is some mild postoperative swelling as expected.  A/P:  Patient is doing well today, recovering well after surgery.  Right JP drain was removed due to low output, we will plan to see patient back in 1 week for possible removal of left JP drain pending output.  Continue with compressive garments, avoid strenuous activities and heavy lifting.  Encouraged ambulation.  Continue to monitor left lower extremity swelling, this is baseline for her.  She has no history of DVT.  Recommend dressing changes to right JP drain insertion site wound with Vaseline and gauze daily.

## 2022-07-26 NOTE — Progress Notes (Unsigned)
Patient is a 67 year old female who underwent panniculectomy with Dr. Ladona Ridgel on 07/08/2022.  She is approximately 2-1/2 weeks postop.  She presents to the clinic today for postoperative follow-up.  Patient was last seen in the clinic on 07/18/2022.  At this visit, patient reported she was doing well.  She reported her drain output was very minimal.  She was not having any infectious symptoms and reported she was ambulating well.  On exam, incision was intact and appear to be healing well.  There is no abdominal erythema or cellulitic changes.  JP drains were in place bilaterally.  Right JP drain was removed due to low output.  Plan was for patient to return in 1 week for possible removal of left drain.   Today,

## 2022-07-27 ENCOUNTER — Ambulatory Visit (INDEPENDENT_AMBULATORY_CARE_PROVIDER_SITE_OTHER): Payer: Medicare HMO | Admitting: Student

## 2022-07-27 VITALS — BP 138/83 | HR 71

## 2022-07-27 DIAGNOSIS — Z9889 Other specified postprocedural states: Secondary | ICD-10-CM

## 2022-08-03 ENCOUNTER — Ambulatory Visit (INDEPENDENT_AMBULATORY_CARE_PROVIDER_SITE_OTHER): Payer: Medicare HMO | Admitting: Surgical

## 2022-08-03 ENCOUNTER — Encounter: Payer: Self-pay | Admitting: Surgical

## 2022-08-03 VITALS — BP 166/92 | HR 67 | Ht 63.0 in | Wt 180.0 lb

## 2022-08-03 DIAGNOSIS — Z9889 Other specified postprocedural states: Secondary | ICD-10-CM

## 2022-08-03 NOTE — Progress Notes (Signed)
67 year old female who underwent panniculectomy with Dr. Ladona Ridgel on 07/08/2022.  She is just shy of 4 weeks postop.  She was last seen in the clinic 1 week ago, had the left JP drain removed.  Patient reports that she is doing well today.  She does have an elevated blood pressure today, reports she did not take her blood pressure medicine this a.m.  She is asymptomatic.  She reports that she has been feeling fatigued, but reports she has been very busy, has had a lot of stressors in her life as well recently.  She reports that she is not having any infectious symptoms or any other symptomatic changes.  She has been active and ambulating frequently.  She is happy with her result so far.  Chaperone present on exam On exam abdominal incision is intact and healing well.  There is no erythema or cellulitic changes noted.  Bilateral JP drain insertion site wounds are healing very well.  There is no subcutaneous fluid collection noted.  No tenderness noted palpation.  No lower extremity swelling is noted.  Patient is well-developed and well-nourished.,  No acute distress  A/P:  Continue with compressive garments Recommend following up in 2 to 3 weeks for reevaluation for final postoperative appointment.  She should continue to avoid any lifting greater than 20 pounds or heavy exercise.  Pictures were obtained of the patient and placed in the chart with the patient's or guardian's permission.

## 2022-08-10 ENCOUNTER — Encounter: Payer: Medicare HMO | Admitting: Student

## 2022-08-25 ENCOUNTER — Ambulatory Visit (INDEPENDENT_AMBULATORY_CARE_PROVIDER_SITE_OTHER): Payer: Medicare HMO | Admitting: Student

## 2022-08-25 DIAGNOSIS — Z9889 Other specified postprocedural states: Secondary | ICD-10-CM

## 2022-08-25 NOTE — Progress Notes (Signed)
Patient is a 67 year old female who underwent panniculectomy with Dr. Ladona Ridgel on 07/08/2022.  She is almost 7 weeks postop.  Patient presents to the clinic today for postoperative follow-up.  Patient was last seen in the clinic on 08/03/2022.  At this visit, patient reported she is doing well.  On exam, incisions were intact and healing well.  Bilateral JP drain sites were healing very well also.  There are no fluid collections on exam.  There is recommended patient follow back up in 2 to 3 weeks for reevaluation.  Today, patient reports she is doing well.  She denies any issues or concerns.  Denies any fevers or chills.  She states that she is happy with her outcome.    Chaperone present on exam.  On exam, patient is sitting upright in no acute distress.  Abdomen is soft and nontender.  There are no significant fluid collections palpated on exam.  No overlying erythema to the abdomen.  Lower abdominal incision is intact and healing well.  There is a suture noted.  This was cut and removed.  Patient tolerated well.  There was a little bit of mild irritation and a very small scant amount of drainage near the suture site.  There are no cellulitic changes of skin.  It was not tender to palpation.  Discussed with the patient would like her to apply Vaseline to the irritation where the suture was.  I told her to keep an eye on this area.  I discussed with her that if it is not improving or if she has any issues or concerns about the area, to let us know.  Patient expressed understanding.  Discussed with the patient that once that little area of irritation completely heals, she may start using scar creams to her incision.  I discussed with her also that she does not have to wear compression at all times anymore and may start gradually increasing her activities.  Patient expressed understanding.  Patient to follow-up as needed.  I instructed the patient to call if she has any questions or concerns about  anything.  Pictures were obtained of the patient and placed in the chart with the patient's or guardian's permission.

## 2023-03-20 ENCOUNTER — Ambulatory Visit: Payer: Medicare HMO

## 2023-03-21 ENCOUNTER — Ambulatory Visit
Admission: RE | Admit: 2023-03-21 | Discharge: 2023-03-21 | Disposition: A | Discharge status: Home / Self Care | Payer: Medicare HMO | Source: Ambulatory Visit | Attending: Internal Medicine | Admitting: Internal Medicine

## 2023-03-21 DIAGNOSIS — Z1231 Encounter for screening mammogram for malignant neoplasm of breast: Secondary | ICD-10-CM

## 2023-05-30 ENCOUNTER — Encounter: Payer: Self-pay | Admitting: Internal Medicine

## 2023-06-16 ENCOUNTER — Ambulatory Visit

## 2023-06-16 VITALS — Ht 63.0 in | Wt 195.0 lb

## 2023-06-16 DIAGNOSIS — Z860100 Personal history of colon polyps, unspecified: Secondary | ICD-10-CM

## 2023-06-16 MED ORDER — NA SULFATE-K SULFATE-MG SULF 17.5-3.13-1.6 GM/177ML PO SOLN
1.0000 | Freq: Once | ORAL | 0 refills | Status: AC
Start: 2023-06-16 — End: 2023-06-16

## 2023-06-16 NOTE — Progress Notes (Signed)
 No egg or soy allergy known to patient  No issues known to pt with past sedation with any surgeries or procedures Patient denies ever being told they had issues or difficulty with intubation  No FH of Malignant Hyperthermia Pt is not on diet pills Pt is not on  home 02  Pt is not on blood thinners  Pt denies issues with constipation  No A fib or A flutter Have any cardiac testing pending-- no  LOA: independent  Prep: suprep   Patient's chart reviewed by Cathlyn Parsons CNRA prior to previsit and patient appropriate for the [facility].  Previsit completed and red dot placed by patient's name on their procedure day (on provider's schedule).     PV completed with patient. Prep instructions sent to home address.

## 2023-06-19 ENCOUNTER — Encounter: Payer: Self-pay | Admitting: Internal Medicine

## 2023-06-29 NOTE — Progress Notes (Unsigned)
 [facility] History and Physical   Primary Care Physician:  Charle Congo, MD   Reason for Procedure:  History of colon polyps  Plan:    Colonoscopy     HPI: Raven Mclean is a 68 y.o. female status post removal of 2 diminutive adenomas by Dr. Howard Macho in 2018.  Here for surveillance colonoscopy exam.   Past Medical History:  Diagnosis Date   Arthritis    Blood transfusion    Chronic back pain    Family history of adverse reaction to anesthesia    mom went into a coma   Hyperlipidemia    Hypertension    Neuromuscular disorder (HCC)    spasms in legs    Past Surgical History:  Procedure Laterality Date   BACK SURGERY     CESAREAN SECTION     CHOLECYSTECTOMY N/A 12/20/2019   Procedure: LAPAROSCOPIC CHOLECYSTECTOMY;  Surgeon: Shela Derby, MD;  Location: [facility] OR;  Service: General;  Laterality: N/A;   COLONOSCOPY     PANNICULECTOMY N/A 07/08/2022   Procedure: PANNICULECTOMY;  Surgeon: Teretha Ferguson, MD;  Location: [facility];  Service: Plastics;  Laterality: N/A;   POLYPECTOMY     TRIGGER FINGER RELEASE     Current Outpatient Medications  Medication Sig Dispense Refill   acetaminophen  (TYLENOL ) 500 MG tablet Take 1,000 mg by mouth every 8 (eight) hours as needed for mild pain, fever or headache.      cholecalciferol  (VITAMIN D3) 25 MCG (1000 UNIT) tablet Take 1 tablet (1,000 Units total) by mouth daily. 30 tablet 0   metoprolol -hydrochlorothiazide  (LOPRESSOR  HCT) 50-25 MG tablet Take 1 tablet by mouth daily.     simvastatin  (ZOCOR ) 20 MG tablet Take 1 tablet (20 mg total) by mouth daily. 30 tablet 0   alum & mag hydroxide-simeth (MAALOX MAX) 400-400-40 MG/5ML suspension Take 15 mLs by mouth every 6 (six) hours as needed for indigestion. (Patient not taking: Reported on 06/30/2023) 355 mL 0   amoxicillin (AMOXIL) 500 MG tablet Take 2,000 mg by mouth daily. (Patient not taking: Reported on 06/30/2023)     diclofenac Sodium (VOLTAREN) 1 % GEL  Apply 2 g topically 4 (four) times daily as needed (pain).      furosemide (LASIX) 20 MG tablet Take 20 mg by mouth daily as needed.     Multiple Vitamins-Minerals (MULTIVITAMIN WITH MINERALS) tablet Take 1 tablet by mouth daily.     ondansetron  (ZOFRAN ) 4 MG tablet Take 1 tablet (4 mg total) by mouth every 8 (eight) hours as needed for up to 20 doses for nausea or vomiting. 20 tablet 0   oxyCODONE  (ROXICODONE ) 5 MG immediate release tablet Take 1 tablet (5 mg total) by mouth every 6 (six) hours as needed for up to 20 doses for severe pain. 20 tablet 0   Semaglutide  (OZEMPIC , 0.25 OR 0.5 MG/DOSE, [place]) Inject into the skin once a week.     Semaglutide -Weight Management (WEGOVY ) 0.25 MG/0.5ML SOAJ Inject 0.25 mg into the skin once a week. (Patient not taking: Reported on 06/30/2023) 4 mL 2   Current Facility-Administered Medications  Medication Dose Route Frequency Provider Last Rate Last Admin   0.9 %  sodium chloride  infusion  500 mL Intravenous Once Kenney Peacemaker, MD        Allergies as of 06/30/2023 - Review Complete 06/30/2023  Allergen Reaction Noted   Hydrocodone -acetaminophen  Itching 02/14/2011    Family History  Problem Relation Age of Onset   Cancer Mother  Heart disease Mother    Heart disease Brother    Cancer Maternal Aunt    Colon cancer Neg Hx    Rectal cancer Neg Hx    Stomach cancer Neg Hx     Social History   Socioeconomic History   Marital status: Widowed    Spouse name: Not on file   Number of children: 2   Years of education: Not on file   Highest education level: Not on file  Occupational History   Not on file  Tobacco Use   Smoking status: Never   Smokeless tobacco: Never  Vaping Use   Vaping status: Never Used  Substance and Sexual Activity   Alcohol use: No   Drug use: No   Sexual activity: Yes  Other Topics Concern   Not on file  Social History Narrative   Not on file   Social Drivers of Health   Financial Resource Strain: Low Risk   (11/28/2019)   Received from [facility], [facility]   Overall Financial Resource Strain (CARDIA)    Difficulty of Paying Living Expenses: Not very hard  Food Insecurity: No Food Insecurity (11/28/2019)   Received from [facility], [facility]   Hunger Vital Sign    Worried About Running Out of Food in the Last Year: Never true    Ran Out of Food in the Last Year: Never true  Transportation Needs: Unmet Transportation Needs (11/28/2019)   Received from [facility], [facility]   [facility] - Transportation    Lack of Transportation (Medical): Yes    Lack of Transportation (Non-Medical): Yes  Physical Activity: Not on file  Stress: Not on file  Social Connections: Not on file  Intimate Partner Violence: Not on file    Review of Systems:  All other review of systems negative except as mentioned in the HPI.  Physical Exam: Vital signs BP (!) 153/75   Pulse 71   Temp (!) 97.5 F (36.4 C) (Temporal)   Resp 19   Ht 5\' 3"  (1.6 m)   Wt 195 lb (88.5 kg)   SpO2 99%   BMI 34.54 kg/m   General:   Alert,  Well-developed, well-nourished, pleasant and cooperative in NAD Lungs:  Clear throughout to auscultation.   Heart:  Regular rate and rhythm; no murmurs, clicks, rubs,  or gallops. Abdomen:  Soft, nontender and nondistended. Normal bowel sounds.   Neuro/Psych:  Alert and cooperative. Normal mood and affect. A and O x 3   @Annikah Lovins  Tammie Fall, MD, [facility] [phone] (pager) 06/30/2023 1:55 PM@

## 2023-06-30 ENCOUNTER — Encounter: Payer: Self-pay | Admitting: Internal Medicine

## 2023-06-30 ENCOUNTER — Ambulatory Visit: Admitting: Internal Medicine

## 2023-06-30 VITALS — BP 147/116 | HR 69 | Temp 97.5°F | Resp 16 | Ht 63.0 in | Wt 195.0 lb

## 2023-06-30 DIAGNOSIS — Z860101 Personal history of adenomatous and serrated colon polyps: Secondary | ICD-10-CM

## 2023-06-30 DIAGNOSIS — Z1211 Encounter for screening for malignant neoplasm of colon: Secondary | ICD-10-CM

## 2023-06-30 DIAGNOSIS — D120 Benign neoplasm of cecum: Secondary | ICD-10-CM

## 2023-06-30 DIAGNOSIS — Z860100 Personal history of colon polyps, unspecified: Secondary | ICD-10-CM

## 2023-06-30 DIAGNOSIS — K5730 Diverticulosis of large intestine without perforation or abscess without bleeding: Secondary | ICD-10-CM

## 2023-06-30 DIAGNOSIS — D122 Benign neoplasm of ascending colon: Secondary | ICD-10-CM

## 2023-06-30 MED ORDER — SODIUM CHLORIDE 0.9 % IV SOLN
500.0000 mL | Freq: Once | INTRAVENOUS | Status: DC
Start: 2023-06-30 — End: 2023-06-30

## 2023-06-30 NOTE — Op Note (Signed)
 [facility] Patient Name: Raven Mclean Procedure Date: 06/30/2023 1:46 PM MRN: [id number] Endoscopist: Kenney Peacemaker , MD, [id number] Age: 68 Referring MD:  Date of Birth: Dec 11, 1955 Gender: Female Account #: [id number] Procedure:                Colonoscopy Indications:              Surveillance: Personal history of adenomatous                            polyps on last colonoscopy > 5 years ago, Last                            colonoscopy: 2018 Medicines:                Monitored Anesthesia Care Procedure:                Pre-Anesthesia Assessment:                           - Prior to the procedure, a History and Physical                            was performed, and patient medications and                            allergies were reviewed. The patient's tolerance of                            previous anesthesia was also reviewed. The risks                            and benefits of the procedure and the sedation                            options and risks were discussed with the patient.                            All questions were answered, and informed consent                            was obtained. Prior Anticoagulants: The patient has                            taken no anticoagulant or antiplatelet agents. ASA                            Grade Assessment: II - A patient with mild systemic                            disease. After reviewing the risks and benefits,                            the patient was deemed in satisfactory condition to  undergo the procedure.                           After obtaining informed consent, the colonoscope                            was passed under direct vision. Throughout the                            procedure, the patient's blood pressure, pulse, and                            oxygen saturations were monitored continuously. The                            CF [id number] #[id number] was introduced through the  anus                            and advanced to the the cecum, identified by                            appendiceal orifice and ileocecal valve. The                            colonoscopy was performed without difficulty. The                            patient tolerated the procedure well. The quality                            of the bowel preparation was good. The ileocecal                            valve, appendiceal orifice, and rectum were                            photographed. The bowel preparation used was SUPREP                            via split dose instruction. Scope In: 2:02:39 PM Scope Out: 2:12:30 PM Scope Withdrawal Time: 0 hours 7 minutes 47 seconds  Total Procedure Duration: 0 hours 9 minutes 51 seconds  Findings:                 The perianal and digital rectal examinations were                            normal.                           Two sessile polyps were found in the ascending                            colon and ileocecal valve. The polyps were  diminutive in size. These polyps were removed with                            a cold snare. Resection and retrieval were                            complete. Verification of patient identification                            for the specimen was done. Estimated blood loss was                            minimal.                           Multiple diverticula were found in the ascending                            colon.                           The exam was otherwise without abnormality on                            direct and retroflexion views. Complications:            No immediate complications. Estimated Blood Loss:     Estimated blood loss: none. Impression:               - Two diminutive polyps in the ascending colon and                            at the ileocecal valve, removed with a cold snare.                            Resected and retrieved.                           -  Diverticulosis in the ascending colon.                           - The examination was otherwise normal on direct                            and retroflexion views.                           - Personal history of colonic polyps. 2 diminutive                            adenomas 2018 Recommendation:           - Patient has a contact number available for                            emergencies. The signs and symptoms of potential  delayed complications were discussed with the                            patient. Return to normal activities tomorrow.                            Written discharge instructions were provided to the                            patient.                           - Resume previous diet.                           - Continue present medications.                           - Repeat colonoscopy is recommended for                            surveillance. The colonoscopy date will be                            determined after pathology results from today's                            exam become available for review. Kenney Peacemaker, MD 06/30/2023 2:22:45 PM This report has been signed electronically.

## 2023-06-30 NOTE — Progress Notes (Signed)
 Report to PACU, RN, vss, BBS= Clear.

## 2023-06-30 NOTE — Progress Notes (Signed)
 Pt's states no medical or surgical changes since previsit or office visit.

## 2023-06-30 NOTE — Progress Notes (Signed)
 Called to room to assist during endoscopic procedure.  Patient ID and intended procedure confirmed with present staff. Received instructions for my participation in the procedure from the performing physician.

## 2023-06-30 NOTE — Patient Instructions (Addendum)
 There were 2 tiny polyps seen and removed today.  I also saw diverticulosis, little pouches or pockets in the colon.  This is a common finding and not typically a problem.  Please read the handout.  I will let you know pathology results and when to have another routine colonoscopy by mail and/or My Chart.  I appreciate the opportunity to care for you. Kenney Peacemaker, MD, [facility]  Discharge instructions given. Handouts on polyps and Diverticulosis. Resume previous medications. YOU HAD AN ENDOSCOPIC PROCEDURE TODAY AT THE [facility]:   Refer to the procedure report that was given to you for any specific questions about what was found during the examination.  If the procedure report does not answer your questions, please call your gastroenterologist to clarify.  If you requested that your care partner not be given the details of your procedure findings, then the procedure report has been included in a sealed envelope for you to review at your convenience later.  YOU SHOULD EXPECT: Some feelings of bloating in the abdomen. Passage of more gas than usual.  Walking can help get rid of the air that was put into your GI tract during the procedure and reduce the bloating. If you had a lower endoscopy (such as a colonoscopy or flexible sigmoidoscopy) you may notice spotting of blood in your stool or on the toilet paper. If you underwent a bowel prep for your procedure, you may not have a normal bowel movement for a few days.  Please Note:  You might notice some irritation and congestion in your nose or some drainage.  This is from the oxygen used during your procedure.  There is no need for concern and it should clear up in a day or so.  SYMPTOMS TO REPORT IMMEDIATELY:  Following lower endoscopy (colonoscopy or flexible sigmoidoscopy):  Excessive amounts of blood in the stool  Significant tenderness or worsening of abdominal pains  Swelling of the abdomen that is new, acute  Fever of 100F  or higher   For urgent or emergent issues, a gastroenterologist can be reached at any hour by calling [phone]. Do not use MyChart messaging for urgent concerns.    DIET:  We do recommend a small meal at first, but then you may proceed to your regular diet.  Drink plenty of fluids but you should avoid alcoholic beverages for 24 hours.  ACTIVITY:  You should plan to take it easy for the rest of today and you should NOT DRIVE or use heavy machinery until tomorrow (because of the sedation medicines used during the test).    FOLLOW UP: Our staff will call the number listed on your records the next business day following your procedure.  We will call around 7:15- 8:00 am to check on you and address any questions or concerns that you may have regarding the information given to you following your procedure. If we do not reach you, we will leave a message.     If any biopsies were taken you will be contacted by phone or by letter within the next 1-3 weeks.  Please call us  at [phone] if you have not heard about the biopsies in 3 weeks.    SIGNATURES/CONFIDENTIALITY: You and/or your care partner have signed paperwork which will be entered into your electronic medical record.  These signatures attest to the fact that that the information above on your After Visit Summary has been reviewed and is understood.  Full responsibility of the confidentiality of  this discharge information lies with you and/or your care-partner.

## 2023-07-03 ENCOUNTER — Telehealth: Payer: Self-pay

## 2023-07-03 NOTE — Telephone Encounter (Signed)
Attempted to reach patient for post-procedure f/u call. No answer. Left message for her to please not hesitate to call if she has any questions/concerns regarding her care. 

## 2023-07-05 LAB — SURGICAL PATHOLOGY

## 2023-07-07 ENCOUNTER — Ambulatory Visit: Payer: Self-pay | Admitting: Internal Medicine

## 2023-09-19 ENCOUNTER — Telehealth: Payer: Self-pay | Admitting: Internal Medicine

## 2023-09-19 NOTE — Telephone Encounter (Signed)
 Inbound call from patient requesting a call to discuss 5/30 colonoscopy results. States she did not receive results letter in the mail. Please advise, thank you

## 2023-09-19 NOTE — Telephone Encounter (Signed)
 Called patient. Discussed pathology and recall date. Patient verbalized understanding. Patient requested a copy of the report be sent to PCP. Faxed through Epic at this time.
